# Patient Record
Sex: Female | Born: 1965 | Race: White | Hispanic: No | Smoking: Former smoker
Health system: Southern US, Community
[De-identification: ages and names within clinical notes are randomized; demographics above are authoritative.]

## PROBLEM LIST (undated history)

## (undated) DIAGNOSIS — I10 Essential (primary) hypertension: Secondary | ICD-10-CM

## (undated) DIAGNOSIS — T7840XA Allergy, unspecified, initial encounter: Secondary | ICD-10-CM

## (undated) DIAGNOSIS — F329 Major depressive disorder, single episode, unspecified: Secondary | ICD-10-CM

## (undated) DIAGNOSIS — F32A Depression, unspecified: Secondary | ICD-10-CM

## (undated) DIAGNOSIS — N809 Endometriosis, unspecified: Secondary | ICD-10-CM

## (undated) DIAGNOSIS — K219 Gastro-esophageal reflux disease without esophagitis: Secondary | ICD-10-CM

## (undated) HISTORY — DX: Major depressive disorder, single episode, unspecified: F32.9

## (undated) HISTORY — DX: Endometriosis, unspecified: N80.9

## (undated) HISTORY — DX: Depression, unspecified: F32.A

## (undated) HISTORY — DX: Gastro-esophageal reflux disease without esophagitis: K21.9

## (undated) HISTORY — DX: Allergy, unspecified, initial encounter: T78.40XA

## (undated) HISTORY — DX: Essential (primary) hypertension: I10

## (undated) HISTORY — PX: CARPAL TUNNEL RELEASE: SHX101

---

## 1991-06-29 DIAGNOSIS — N809 Endometriosis, unspecified: Secondary | ICD-10-CM

## 1991-06-29 HISTORY — PX: LAPAROSCOPIC OVARIAN CYSTECTOMY: SHX6248

## 1991-06-29 HISTORY — DX: Endometriosis, unspecified: N80.9

## 2013-12-06 LAB — HM PAP SMEAR: HM Pap smear: ABNORMAL

## 2013-12-06 LAB — HM MAMMOGRAPHY: HM Mammogram: NORMAL

## 2014-03-18 ENCOUNTER — Ambulatory Visit: Payer: Self-pay | Admitting: Family Medicine

## 2014-06-10 ENCOUNTER — Ambulatory Visit (INDEPENDENT_AMBULATORY_CARE_PROVIDER_SITE_OTHER): Payer: Managed Care, Other (non HMO) | Admitting: Internal Medicine

## 2014-06-10 ENCOUNTER — Encounter (INDEPENDENT_AMBULATORY_CARE_PROVIDER_SITE_OTHER): Payer: Self-pay

## 2014-06-10 ENCOUNTER — Encounter: Payer: Self-pay | Admitting: Internal Medicine

## 2014-06-10 VITALS — BP 124/84 | HR 85 | Temp 98.6°F | Ht 67.5 in | Wt 215.0 lb

## 2014-06-10 DIAGNOSIS — F418 Other specified anxiety disorders: Secondary | ICD-10-CM

## 2014-06-10 DIAGNOSIS — E669 Obesity, unspecified: Secondary | ICD-10-CM

## 2014-06-10 DIAGNOSIS — K219 Gastro-esophageal reflux disease without esophagitis: Secondary | ICD-10-CM

## 2014-06-10 DIAGNOSIS — F329 Major depressive disorder, single episode, unspecified: Secondary | ICD-10-CM | POA: Insufficient documentation

## 2014-06-10 DIAGNOSIS — I10 Essential (primary) hypertension: Secondary | ICD-10-CM

## 2014-06-10 DIAGNOSIS — J302 Other seasonal allergic rhinitis: Secondary | ICD-10-CM

## 2014-06-10 DIAGNOSIS — F419 Anxiety disorder, unspecified: Secondary | ICD-10-CM

## 2014-06-10 DIAGNOSIS — F32A Depression, unspecified: Secondary | ICD-10-CM | POA: Insufficient documentation

## 2014-06-10 DIAGNOSIS — G4700 Insomnia, unspecified: Secondary | ICD-10-CM | POA: Insufficient documentation

## 2014-06-10 NOTE — Assessment & Plan Note (Signed)
She is not currently exercising or following any diet plan Encouraged her to work on this

## 2014-06-10 NOTE — Progress Notes (Signed)
HPI  Pt presents to the clinic today to establish care. She is transferring care from her PCP in [place]. She recently moved to this area.  Anxiety and Depression: mild but chronic. Seems to get worse with changes in life situations. She takes the Prozac daily. She takes the xanax a few times per week. She has been seeing a therapist which has seemed to really help her a lot.  GERD: It will flare up when she eats certain things. This only occurs 1-2 times per month. She was on Prevacid at one time but reports now she maintains it with changes in her diet.  Allergies: She will take Flonase when her symptoms occur.  HTN:  Well controlled on Lisinopril. Her BP today is 124/84.  She does have some concerns today about difficulty sleeping and fatigue. She reports she goes to sleep fine but staying asleep is the issue. She gets up twice a night to urinate and is awake for about 30 minutes- 1 hour each time. She has not tried anything for this OTC.  Flu: never Tetanus: 11/2013 LMP: takes OCP continuously Pap Smear: 11/2013 Mammogram: 11/2013 Vision Screening: annually Dentist: biannually  Past Medical History  Diagnosis Date  . Depression   . GERD (gastroesophageal reflux disease)   . Allergy   . Hypertension   . Endometriosis determined by laparoscopy 1993    Current Outpatient Prescriptions  Medication Sig Dispense Refill  . ALPRAZolam (XANAX) 0.5 MG tablet Take by mouth.    Marland Kitchen. FLUoxetine (PROZAC) 20 MG capsule Take by mouth.    . fluticasone (FLONASE) 50 MCG/ACT nasal spray Place 2 sprays into both nostrils daily.    Marland Kitchen. ibuprofen (ADVIL,MOTRIN) 200 MG tablet Take by mouth.    Marland Kitchen. lisinopril (PRINIVIL,ZESTRIL) 20 MG tablet Take by mouth.    . magnesium 30 MG tablet Take 30 mg by mouth daily.    . norethindrone-ethinyl estradiol (JUNEL FE,GILDESS FE,LOESTRIN FE) 1-20 MG-MCG tablet Take by mouth.    . vitamin B-12 (CYANOCOBALAMIN) 1000 MCG tablet Take 1,000 mcg by mouth daily.     No  current facility-administered medications for this visit.    Not on File  Family History  Problem Relation Age of Onset  . Alcohol abuse Mother   . Drug abuse Mother   . Cancer Mother     uterine  . Alcohol abuse Father   . Diabetes Father   . Stroke Maternal Grandmother   . Kidney disease Maternal Grandmother     History   Social History  . Marital Status: Married    Spouse Name: N/A    Number of Children: N/A  . Years of Education: N/A   Occupational History  . Not on file.   Social History Main Topics  . Smoking status: Former Smoker    Types: Cigarettes  . Smokeless tobacco: Never Used     Comment: quit x 15 years ago  . Alcohol Use: 0.0 oz/week    0 Not specified per week     Comment: social  . Drug Use: Not on file  . Sexual Activity: Not on file   Other Topics Concern  . Not on file   Social History Narrative  . No narrative on file    ROS:  Constitutional: Pt reports fatigue. Denies fever, malaise, headache or abrupt weight changes.  HEENT: Denies eye pain, eye redness, ear pain, ringing in the ears, wax buildup, runny nose, nasal congestion, bloody nose, or sore throat. Respiratory: Denies difficulty breathing, shortness  of breath, cough or sputum production.   Cardiovascular: Denies chest pain, chest tightness, palpitations or swelling in the hands or feet.  Gastrointestinal: Denies abdominal pain, bloating, constipation, diarrhea or blood in the stool.  Skin: Denies redness, rashes, lesions or ulcercations.  Neurological: Denies dizziness, difficulty with memory, difficulty with speech or problems with balance and coordination.  Psych: Pt reports anxiety and depression. Denies SI/HI.  No other specific complaints in a complete review of systems (except as listed in HPI above).  PE:  BP 124/84 mmHg  Pulse 85  Temp(Src) 98.6 F (37 C) (Oral)  Ht 5' 7.5" (1.715 m)  Wt 215 lb (97.523 kg)  BMI 33.16 kg/m2  SpO2 98%  LMP  Wt Readings from  Last 3 Encounters:  06/10/14 215 lb (97.523 kg)    General: Appears her stated age, obese but well developed, well nourished in NAD. HEENT: Head: normal shape and size; Ears: Tm's gray and intact, normal light reflex; Nose: mucosa pink and moist, septum midline; Throat/Mouth: Teeth present, mucosa pink and moist, no lesions or ulcerations noted.  Cardiovascular: Normal rate and rhythm. S1,S2 noted.  No murmur, rubs or gallops noted.  Pulmonary/Chest: Normal effort and positive vesicular breath sounds. No respiratory distress. No wheezes, rales or ronchi noted.  Abdomen: Soft and nontender. Normal bowel sounds, no bruits noted. No distention or masses noted.  Neurological: Alert and oriented. Cranial nerves II-XII grossly intact.  Psychiatric: Mood and affect normal. Behavior is normal. Judgment and thought content normal.     Assessment and Plan:  Health Maintenance:  She declines flu shot today  RTC in 6 months for annual physical

## 2014-06-10 NOTE — Assessment & Plan Note (Signed)
Well controlled on Prozac and prn xanax She will need to complete CSA and UDS at her next visit Will continue current therapy for now Continue to see your therapist

## 2014-06-10 NOTE — Patient Instructions (Signed)

## 2014-06-10 NOTE — Assessment & Plan Note (Signed)
Controlled with Flonase prn Continue current therapy for now

## 2014-06-10 NOTE — Progress Notes (Signed)
Pre visit review using our clinic review tool, if applicable. No additional management support is needed unless otherwise documented below in the visit note. 

## 2014-06-10 NOTE — Assessment & Plan Note (Signed)
Well controlled on Lisinopril Will check CBC and CMET at next visit

## 2014-06-10 NOTE — Assessment & Plan Note (Signed)
?   If this is related to her fluid intake before bed or if it is a component of OSA Will have her cut off fluids around 8 pm If issue persist, we can do OSA workup giben obesity and HTN

## 2014-06-10 NOTE — Assessment & Plan Note (Signed)
Occassional Advised her to take Tums OTC if needed If becomes more frequent, we may need to consider H2 blocker or PPI

## 2014-06-11 ENCOUNTER — Telehealth: Payer: Self-pay | Admitting: Internal Medicine

## 2014-06-11 NOTE — Telephone Encounter (Signed)
emmi emailed °

## 2014-06-14 ENCOUNTER — Encounter: Payer: Self-pay | Admitting: Internal Medicine

## 2014-06-14 MED ORDER — ALPRAZOLAM 0.5 MG PO TABS: 0.5000 mg | ORAL_TABLET | Freq: Two times a day (BID) | ORAL | Status: DC | PRN

## 2014-06-14 NOTE — Telephone Encounter (Signed)
Rx called in to pharmacy. 

## 2014-07-23 ENCOUNTER — Other Ambulatory Visit: Payer: Self-pay | Admitting: Internal Medicine

## 2014-07-23 NOTE — Telephone Encounter (Signed)
Last filled 06/14/14--please advise

## 2014-07-24 NOTE — Telephone Encounter (Signed)
Ok to phone in xanax. Will need CSA and UDS 

## 2014-07-24 NOTE — Telephone Encounter (Signed)
Called pt was unable lmovm due to vm box being full will try again later

## 2014-07-25 ENCOUNTER — Other Ambulatory Visit: Payer: Self-pay | Admitting: Internal Medicine

## 2014-07-25 NOTE — Telephone Encounter (Signed)
Rx called in to pharmacy. 

## 2014-09-03 ENCOUNTER — Other Ambulatory Visit: Payer: Self-pay | Admitting: Internal Medicine

## 2014-09-03 NOTE — Telephone Encounter (Signed)
Last filled 06/04/15--please advise 

## 2014-09-03 NOTE — Telephone Encounter (Signed)
Ok to phone in xanax 

## 2014-09-04 NOTE — Telephone Encounter (Signed)
Rx called in to pharmacy. 

## 2014-10-07 ENCOUNTER — Other Ambulatory Visit: Payer: Self-pay | Admitting: Internal Medicine

## 2014-10-07 NOTE — Telephone Encounter (Signed)
Last filled 09/03/2014--please advise

## 2014-10-08 NOTE — Telephone Encounter (Signed)
Called pt but was unable to lmovm due to mailbox being full

## 2014-10-08 NOTE — Telephone Encounter (Signed)
Ok to phone in xanax. She needs to complete CSA and UDS

## 2014-10-09 NOTE — Telephone Encounter (Signed)
Spoke with pt and she is aware she will ned to come in to fill out CSA before her xanax can be filled--pt expressed understanding and stated she will come into the office within the next couple of days

## 2014-10-09 NOTE — Telephone Encounter (Signed)
Called pt but mailbox is still full on vm so unable to lmovm--will try again later

## 2014-10-14 ENCOUNTER — Other Ambulatory Visit: Payer: Self-pay | Admitting: Internal Medicine

## 2014-10-15 ENCOUNTER — Encounter: Payer: Self-pay | Admitting: Internal Medicine

## 2014-10-15 MED ORDER — ALPRAZOLAM 0.5 MG PO TABS: 0.5000 mg | ORAL_TABLET | Freq: Two times a day (BID) | ORAL | Status: DC | PRN

## 2014-10-15 NOTE — Addendum Note (Signed)
Addended by: Roena MaladyEVONTENNO, Jaslene Marsteller Y on: 10/15/2014 11:37 AM   Modules accepted: Orders

## 2014-10-15 NOTE — Telephone Encounter (Signed)
Pt left v/m wanting to know if alprazolam was going to be refilled; pt request cb at [phone].

## 2014-10-15 NOTE — Telephone Encounter (Signed)
Rx called in to pharmacy. 

## 2014-10-15 NOTE — Telephone Encounter (Signed)
Error

## 2014-10-21 ENCOUNTER — Other Ambulatory Visit: Payer: Self-pay | Admitting: Nurse Practitioner

## 2014-11-21 ENCOUNTER — Other Ambulatory Visit: Payer: Self-pay | Admitting: Internal Medicine

## 2014-11-21 NOTE — Telephone Encounter (Signed)
Last filled 10/15/2014--please advise

## 2014-11-21 NOTE — Telephone Encounter (Signed)
Ok to phone in xanax 

## 2014-11-22 NOTE — Telephone Encounter (Signed)
Rx called in to pharmacy. 

## 2014-12-26 ENCOUNTER — Telehealth: Payer: Self-pay | Admitting: Internal Medicine

## 2014-12-26 NOTE — Telephone Encounter (Signed)
Last filled 11/21/2014--please advise

## 2014-12-26 NOTE — Telephone Encounter (Signed)
She was only taking this prn, it seems now she is taking daily. I want her to repeat her UDS with her next refill. OK to phone in Xanax

## 2015-01-06 ENCOUNTER — Other Ambulatory Visit: Payer: Self-pay | Admitting: Internal Medicine

## 2015-01-07 ENCOUNTER — Other Ambulatory Visit: Payer: Self-pay | Admitting: Internal Medicine

## 2015-01-07 MED ORDER — ALPRAZOLAM 0.5 MG PO TABS: 0.5000 mg | ORAL_TABLET | Freq: Two times a day (BID) | ORAL | Status: DC | PRN

## 2015-01-07 NOTE — Telephone Encounter (Signed)
Rx called into pharmacy i checked with the pharmacy and last refill was 10/2014---looks like i never received msg for approval

## 2015-01-07 NOTE — Telephone Encounter (Signed)
Melissa Cowan- Can you check to see if this was called in?

## 2015-01-07 NOTE — Telephone Encounter (Signed)
Rx has been called --email has already been sent to pt

## 2015-01-07 NOTE — Telephone Encounter (Signed)
Pt left v/m requesting cb with reason for denial of xanax.

## 2015-01-22 ENCOUNTER — Ambulatory Visit (INDEPENDENT_AMBULATORY_CARE_PROVIDER_SITE_OTHER): Payer: Managed Care, Other (non HMO) | Admitting: Internal Medicine

## 2015-01-22 ENCOUNTER — Encounter: Payer: Self-pay | Admitting: Internal Medicine

## 2015-01-22 VITALS — BP 128/82 | HR 74 | Temp 98.7°F | Ht 67.0 in | Wt 211.0 lb

## 2015-01-22 DIAGNOSIS — F418 Other specified anxiety disorders: Secondary | ICD-10-CM | POA: Diagnosis not present

## 2015-01-22 DIAGNOSIS — K219 Gastro-esophageal reflux disease without esophagitis: Secondary | ICD-10-CM | POA: Diagnosis not present

## 2015-01-22 DIAGNOSIS — Z0000 Encounter for general adult medical examination without abnormal findings: Secondary | ICD-10-CM

## 2015-01-22 DIAGNOSIS — R5383 Other fatigue: Secondary | ICD-10-CM

## 2015-01-22 DIAGNOSIS — I10 Essential (primary) hypertension: Secondary | ICD-10-CM | POA: Diagnosis not present

## 2015-01-22 DIAGNOSIS — R1031 Right lower quadrant pain: Secondary | ICD-10-CM

## 2015-01-22 DIAGNOSIS — F329 Major depressive disorder, single episode, unspecified: Secondary | ICD-10-CM

## 2015-01-22 DIAGNOSIS — G4700 Insomnia, unspecified: Secondary | ICD-10-CM | POA: Diagnosis not present

## 2015-01-22 DIAGNOSIS — F419 Anxiety disorder, unspecified: Secondary | ICD-10-CM

## 2015-01-22 DIAGNOSIS — J302 Other seasonal allergic rhinitis: Secondary | ICD-10-CM

## 2015-01-22 DIAGNOSIS — E669 Obesity, unspecified: Secondary | ICD-10-CM

## 2015-01-22 DIAGNOSIS — R1032 Left lower quadrant pain: Secondary | ICD-10-CM

## 2015-01-22 LAB — COMPREHENSIVE METABOLIC PANEL
ALBUMIN: 4.1 g/dL (ref 3.5–5.2)
ALT: 15 U/L (ref 0–35)
AST: 14 U/L (ref 0–37)
Alkaline Phosphatase: 57 U/L (ref 39–117)
BILIRUBIN TOTAL: 0.3 mg/dL (ref 0.2–1.2)
BUN: 11 mg/dL (ref 6–23)
CO2: 27 mEq/L (ref 19–32)
Calcium: 9.4 mg/dL (ref 8.4–10.5)
Chloride: 102 mEq/L (ref 96–112)
Creatinine, Ser: 0.7 mg/dL (ref 0.40–1.20)
GFR: 95.45 mL/min (ref >60.00)
Glucose, Bld: 74 mg/dL (ref 70–99)
POTASSIUM: 4.1 meq/L (ref 3.5–5.1)
SODIUM: 135 meq/L (ref 135–145)
TOTAL PROTEIN: 7.2 g/dL (ref 6.0–8.3)

## 2015-01-22 LAB — CBC
HCT: 35.9 % — ABNORMAL LOW (ref 36.0–46.0)
HEMOGLOBIN: 11.9 g/dL — AB (ref 12.0–15.0)
MCHC: 33.3 g/dL (ref 30.0–36.0)
MCV: 86.6 fl (ref 78.0–100.0)
PLATELETS: 336 10*3/uL (ref 150.0–400.0)
RBC: 4.14 Mil/uL (ref 3.87–5.11)
RDW: 13.8 % (ref 11.5–15.5)
WBC: 7.7 10*3/uL (ref 4.0–10.5)

## 2015-01-22 LAB — LIPID PANEL
CHOL/HDL RATIO: 4
Cholesterol: 178 mg/dL (ref 0–200)
HDL: 49.1 mg/dL (ref >39.00)
NonHDL: 128.9
Triglycerides: 361 mg/dL — ABNORMAL HIGH (ref 0.0–149.0)
VLDL: 72.2 mg/dL — ABNORMAL HIGH (ref 0.0–40.0)

## 2015-01-22 LAB — LDL CHOLESTEROL, DIRECT: Direct LDL: 85 mg/dL

## 2015-01-22 LAB — VITAMIN D 25 HYDROXY (VIT D DEFICIENCY, FRACTURES): VITD: 19.17 ng/mL — AB (ref 30.00–100.00)

## 2015-01-22 LAB — TSH: TSH: 2 u[IU]/mL (ref 0.35–4.50)

## 2015-01-22 LAB — HEMOGLOBIN A1C: Hgb A1c MFr Bld: 5.4 % (ref 4.6–6.5)

## 2015-01-22 LAB — VITAMIN B12: VITAMIN B 12: 286 pg/mL (ref 211–911)

## 2015-01-22 NOTE — Assessment & Plan Note (Signed)
Likely the cause of her chest symptoms Start taking Zantac daily Will check CBC and CMET today

## 2015-01-22 NOTE — Assessment & Plan Note (Signed)
Continue Xanax prn to help sleep Will check CBC and CMET today

## 2015-01-22 NOTE — Assessment & Plan Note (Signed)
Continue Flonase prn 

## 2015-01-22 NOTE — Progress Notes (Signed)
Pre visit review using our clinic review tool, if applicable. No additional management support is needed unless otherwise documented below in the visit note. 

## 2015-01-22 NOTE — Assessment & Plan Note (Signed)
Well controlled on Prozac and prn Xanax Will check CBC and CMET today

## 2015-01-22 NOTE — Patient Instructions (Signed)

## 2015-01-22 NOTE — Progress Notes (Signed)
Subjective:    Patient ID: Melissa Cowan, female    DOB: [birth year], 49 y.o.   MRN: [id number]  HPI  Pt presents to the clinic today for her annual exam. She also wants to follow up her chronic conditions.  Flu: never Tetanus: 11/2013 LMP: No menses on OCP Pap Smear: 11/2013 Mammogram: 11/2013 Vision Screening: as needed Dentist: biannually  Diet: She has cut out red meat and dairy. She is eating a lot of fish. She does consume some fruits and veggies. She drinks mostly water and green tea. Exercise: She is walking at the gym 1 day per week.  HTN: BP well controlled on Lisinopril. She has had an odd sensation in her chest for the last 2 weeks. She describes the sensation as a "catch". She denies chest pain, chest tightness or shortness of breath. Her BP today is 128/82.  GERD: Triggered by soda, tomato sauce, and spicy food. She takes Zantac prn.  Anxiety and Depression: Symptoms controlled on Prozac. She takes Xanax 1-2 times per day.  Seasonal Allergies: Worse in the spring. She takes Flonase prn.  Insomnia: She takes the Xanax at night every night to help her sleep.  She has been having abdominal pain. This occurred yesterday in her lower abdomen. She describes the pain as crampy. She reports it felt like a mixture of menstrual cramps and gas. She did try some GasX with some relief. She has not had issues with constipation or diarrhea. No blood in her stool. She did have similar pains in the past but was not worked up for it because the pain went away. She does have a history of endometriosis. She denies any vaginal or urinary complaints. She does not menstruate due to her OCP.  She also reports fatigue. This has been intermittent over the last 2 months. She did join a gym 2 weeks ago.  Review of Systems      Past Medical History  Diagnosis Date  . Depression   . GERD (gastroesophageal reflux disease)   . Allergy   . Hypertension   . Endometriosis determined by  laparoscopy 1993    Current Outpatient Prescriptions  Medication Sig Dispense Refill  . ALPRAZolam (XANAX) 0.5 MG tablet Take 1 tablet (0.5 mg total) by mouth 2 (two) times daily as needed. 60 tablet 0  . FLUoxetine (PROZAC) 20 MG capsule Take by mouth.    . fluticasone (FLONASE) 50 MCG/ACT nasal spray Place 2 sprays into both nostrils daily.    Marland Kitchen ibuprofen (ADVIL,MOTRIN) 200 MG tablet Take by mouth.    Marland Kitchen lisinopril (PRINIVIL,ZESTRIL) 20 MG tablet Take by mouth.    . magnesium 30 MG tablet Take 30 mg by mouth daily.    . norethindrone-ethinyl estradiol (JUNEL FE,GILDESS FE,LOESTRIN FE) 1-20 MG-MCG tablet Take by mouth.    . vitamin B-12 (CYANOCOBALAMIN) 1000 MCG tablet Take 1,000 mcg by mouth daily.     No current facility-administered medications for this visit.    Allergies  Allergen Reactions  . Amoxicillin-Pot Clavulanate Rash  . Tetracycline Rash    Family History  Problem Relation Age of Onset  . Alcohol abuse Mother   . Drug abuse Mother   . Cancer Mother     uterine  . Alcohol abuse Father   . Diabetes Father   . Stroke Maternal Grandmother   . Kidney disease Maternal Grandmother     History   Social History  . Marital Status: Married    Spouse Name: N/A  . Number  of Children: N/A  . Years of Education: N/A   Occupational History  . Not on file.   Social History Main Topics  . Smoking status: Former Smoker    Types: Cigarettes  . Smokeless tobacco: Never Used     Comment: quit x 15 years ago  . Alcohol Use: 0.0 oz/week    0 Standard drinks or equivalent per week     Comment: social  . Drug Use: No  . Sexual Activity: Not Currently   Other Topics Concern  . Not on file   Social History Narrative     Constitutional: Pt reports fatigue. Denies fever, malaise, headache or abrupt weight changes.  HEENT: Denies eye pain, eye redness, ear pain, ringing in the ears, wax buildup, runny nose, nasal congestion, bloody nose, or sore throat. Respiratory:  Denies difficulty breathing, shortness of breath, cough or sputum production.   Cardiovascular: Denies chest pain, chest tightness, palpitations or swelling in the hands or feet.  Gastrointestinal: Pt reports lower abdominal pain. Denies bloating, constipation, diarrhea or blood in the stool.  GU: Denies urgency, frequency, pain with urination, burning sensation, blood in urine, odor or discharge. Musculoskeletal: Denies decrease in range of motion, difficulty with gait, muscle pain or joint pain and swelling.  Skin: Denies redness, rashes, lesions or ulcercations.  Neurological: Denies dizziness, difficulty with memory, difficulty with speech or problems with balance and coordination.  Psych: Pt has chronic anxiety and depression. Denies SI/HI.  No other specific complaints in a complete review of systems (except as listed in HPI above).  Objective:   Physical Exam   BP 128/82 mmHg  Pulse 74  Temp(Src) 98.7 F (37.1 C) (Oral)  Ht  (1.702 m)  Wt 211 lb (95.709 kg)  BMI 33.04 kg/m2  SpO2 98% Wt Readings from Last 3 Encounters:  01/22/15 211 lb (95.709 kg)  06/10/14 215 lb (97.523 kg)    General: Appears her stated age, obese in NAD. Skin: Warm, dry and intact. No rashes, lesions or ulcerations noted. HEENT: Head: normal shape and size; Eyes: sclera white, no icterus, conjunctiva pink, PERRLA and EOMs intact; Ears: Tm's gray and intact, normal light reflex;  Throat/Mouth: Teeth present, mucosa pink and moist, no exudate, lesions or ulcerations noted.  Neck: Neck supple, trachea midline. No masses, lumps or thyromegaly present.  Cardiovascular: Normal rate and rhythm. S1,S2 noted.  No murmur, rubs or gallops noted. No JVD or BLE edema. No carotid bruits noted. Pulmonary/Chest: Normal effort and positive vesicular breath sounds. No respiratory distress. No wheezes, rales or ronchi noted.  Abdomen: Soft and nontender. Normal bowel sounds, no bruits noted. No distention or masses  noted. Liver, spleen and kidneys non palpable. Musculoskeletal: Normal range of motion. Strength 5/5 BUE/BLE. No difficulty with gait.  Neurological: Alert and oriented. Cranial nerves II-XII grossly intact. Coordination normal.  Psychiatric: She seems mildly anxious. Mood and affect normal. Behavior is normal. Judgment and thought content normal.       Assessment & Plan:   Preventative Health Maintenance:  Advised her to get a flu shot in the fall Tetanus UTD Mammogram and Pap Smear UTD Encouraged her to see an eye doctor at least annually Continue seeing a dentist biannually Encouraged her to work on diet and exercise  Fatigue:  Will check CBC, CMET, TSH, Vit D, and B12 today  Lower abdominal pain:  Symptoms have resolved at this time Will check CBC, CMET today  RTC in 6 months to follow up chronic conditions

## 2015-01-22 NOTE — Assessment & Plan Note (Signed)
Encouraged her to work on diet an exercise

## 2015-01-22 NOTE — Assessment & Plan Note (Addendum)
Well controlled on Lisinopril CBC and CMET today ECG today - flipped t waves in V1 but otherwise normal

## 2015-01-23 ENCOUNTER — Encounter: Payer: Self-pay | Admitting: Internal Medicine

## 2015-01-23 ENCOUNTER — Other Ambulatory Visit: Payer: Self-pay | Admitting: Internal Medicine

## 2015-01-23 MED ORDER — VITAMIN D (ERGOCALCIFEROL) 1.25 MG (50000 UNIT) PO CAPS: 50000.0000 [IU] | ORAL_CAPSULE | ORAL | Status: DC

## 2015-02-10 ENCOUNTER — Other Ambulatory Visit: Payer: Self-pay | Admitting: Internal Medicine

## 2015-02-10 NOTE — Telephone Encounter (Signed)
Ok to phone in xanax 

## 2015-02-10 NOTE — Telephone Encounter (Signed)
Last filled 01/07/15--please advise 

## 2015-02-11 NOTE — Telephone Encounter (Signed)
Rx called in to pharmacy. 

## 2015-02-27 ENCOUNTER — Encounter: Payer: Self-pay | Admitting: Internal Medicine

## 2015-02-27 ENCOUNTER — Ambulatory Visit (INDEPENDENT_AMBULATORY_CARE_PROVIDER_SITE_OTHER): Payer: Managed Care, Other (non HMO) | Admitting: Internal Medicine

## 2015-02-27 VITALS — BP 132/86 | HR 76 | Temp 98.7°F | Wt 213.0 lb

## 2015-02-27 DIAGNOSIS — J069 Acute upper respiratory infection, unspecified: Secondary | ICD-10-CM | POA: Diagnosis not present

## 2015-02-27 DIAGNOSIS — B9789 Other viral agents as the cause of diseases classified elsewhere: Principal | ICD-10-CM

## 2015-02-27 MED ORDER — AZITHROMYCIN 250 MG PO TABS: ORAL_TABLET | ORAL | Status: DC

## 2015-02-27 MED ORDER — HYDROCODONE-HOMATROPINE 5-1.5 MG/5ML PO SYRP: 5.0000 mL | ORAL_SOLUTION | Freq: Three times a day (TID) | ORAL | Status: DC | PRN

## 2015-02-27 NOTE — Patient Instructions (Signed)
Cough, Adult  A cough is a reflex that helps clear your throat and airways. It can help heal the body or may be a reaction to an irritated airway. A cough may only last 2 or 3 weeks (acute) or may last more than 8 weeks (chronic).  CAUSES Acute cough:  Viral or bacterial infections. Chronic cough:  Infections.  Allergies.  Asthma.  Post-nasal drip.  Smoking.  Heartburn or acid reflux.  Some medicines.  Chronic lung problems (COPD).  Cancer. SYMPTOMS   Cough.  Fever.  Chest pain.  Increased breathing rate.  High-pitched whistling sound when breathing (wheezing).  Colored mucus that you cough up (sputum). TREATMENT   A bacterial cough may be treated with antibiotic medicine.  A viral cough must run its course and will not respond to antibiotics.  Your caregiver may recommend other treatments if you have a chronic cough. HOME CARE INSTRUCTIONS   Only take over-the-counter or prescription medicines for pain, discomfort, or fever as directed by your caregiver. Use cough suppressants only as directed by your caregiver.  Use a cold steam vaporizer or humidifier in your bedroom or home to help loosen secretions.  Sleep in a semi-upright position if your cough is worse at night.  Rest as needed.  Stop smoking if you smoke. SEEK IMMEDIATE MEDICAL CARE IF:   You have pus in your sputum.  Your cough starts to worsen.  You cannot control your cough with suppressants and are losing sleep.  You begin coughing up blood.  You have difficulty breathing.  You develop pain which is getting worse or is uncontrolled with medicine.  You have a fever. MAKE SURE YOU:   Understand these instructions.  Will watch your condition.  Will get help right away if you are not doing well or get worse. Document Released: 12/11/2010 Document Revised: 09/06/2011 Document Reviewed: 12/11/2010 Exit[employer] Patient Information 2015 [employer]. This information is not intended  to replace advice given to you by your health care provider. Make sure you discuss any questions you have with your health care provider.  

## 2015-02-27 NOTE — Progress Notes (Signed)
Pre visit review using our clinic review tool, if applicable. No additional management support is needed unless otherwise documented below in the visit note. 

## 2015-02-27 NOTE — Progress Notes (Signed)
HPI  Pt presents to the clinic today with headache and cough. This started 1 week ago. The headache is located in her forehead. She describes the pain as throbbing. She does have some chest congestion as well. The cough is productive of yellow mucous. She does have some associated shortness of breath and wheezing. She has tried Tylenol sinus, Sudafed, Claritin and Flonase with minimal relief. She does have a history of seasonal allergies. She has not had sick contacts that she is aware of. She is former smoker.  Review of Systems      Past Medical History  Diagnosis Date  . Depression   . GERD (gastroesophageal reflux disease)   . Allergy   . Hypertension   . Endometriosis determined by laparoscopy 1993    Family History  Problem Relation Age of Onset  . Alcohol abuse Mother   . Drug abuse Mother   . Cancer Mother     uterine  . Alcohol abuse Father   . Diabetes Father   . Stroke Maternal Grandmother   . Kidney disease Maternal Grandmother     Social History   Social History  . Marital Status: Married    Spouse Name: N/A  . Number of Children: N/A  . Years of Education: N/A   Occupational History  . Not on file.   Social History Main Topics  . Smoking status: Former Smoker    Types: Cigarettes  . Smokeless tobacco: Never Used     Comment: quit x 15 years ago  . Alcohol Use: 0.0 oz/week    0 Standard drinks or equivalent per week     Comment: social  . Drug Use: No  . Sexual Activity: Not Currently   Other Topics Concern  . Not on file   Social History Narrative    Allergies  Allergen Reactions  . Amoxicillin-Pot Clavulanate Rash  . Tetracycline Rash     Constitutional: Positive headache, fatigueDenies fever or abrupt weight changes.  HEENT:  Denies eye redness, eye pain, pressure behind the eyes, facial pain, nasal congestion, ear pain, ringing in the ears, wax buildup, runny nose or sore throat. Respiratory: Positive cough and shortness of breath.  Denies difficulty breathing.  Cardiovascular: Denies chest pain, chest tightness, palpitations or swelling in the hands or feet.   No other specific complaints in a complete review of systems (except as listed in HPI above).  Objective:   Pulse 76  Temp(Src) 98.7 F (37.1 C) (Oral)  Wt 213 lb (96.616 kg)  SpO2 98% Wt Readings from Last 3 Encounters:  02/27/15 213 lb (96.616 kg)  01/22/15 211 lb (95.709 kg)  06/10/14 215 lb (97.523 kg)     General: Appears her stated age, well developed, well nourished in NAD. HEENT: Head: normal shape and size, no sinus tenderness noted; Eyes: sclera white, no icterus, conjunctiva pink; Ears: Tm's gray and intact, normal light reflex; Nose: mucosa pink and moist, septum midline; Throat/Mouth: Teeth present, mucosa pink and moist, no exudate noted, no lesions or ulcerations noted.  Neck: No cervical lymphadenopathy.  Cardiovascular: Normal rate and rhythm. S1,S2 noted.  No murmur, rubs or gallops noted.  Pulmonary/Chest: Normal effort and diminshed vesicular breath sounds. No respiratory distress. No wheezes, rales or ronchi noted.      Assessment & Plan:   Upper Respiratory Infection:  Likely viral at this time Get some rest and drink plenty of water Try Mucinex Rx for Hycodan cough syrup for cough  Given printed RX for zpack to  start on Saturday if symptoms seem to be worsening  RTC as needed or if symptoms persist.

## 2015-03-13 ENCOUNTER — Other Ambulatory Visit: Payer: Self-pay | Admitting: Internal Medicine

## 2015-03-13 NOTE — Telephone Encounter (Signed)
Ok to phone in Xanax 

## 2015-03-13 NOTE — Telephone Encounter (Signed)
Last filled 02/10/15--please advise

## 2015-03-13 NOTE — Telephone Encounter (Signed)
Rx called in to pharmacy. 

## 2015-03-17 ENCOUNTER — Ambulatory Visit (INDEPENDENT_AMBULATORY_CARE_PROVIDER_SITE_OTHER): Payer: Managed Care, Other (non HMO) | Admitting: Nurse Practitioner

## 2015-03-17 ENCOUNTER — Encounter: Payer: Self-pay | Admitting: Nurse Practitioner

## 2015-03-17 ENCOUNTER — Other Ambulatory Visit: Payer: Self-pay | Admitting: Internal Medicine

## 2015-03-17 VITALS — BP 132/98 | HR 80 | Temp 98.3°F | Resp 16 | Ht 67.0 in | Wt 192.0 lb

## 2015-03-17 DIAGNOSIS — J069 Acute upper respiratory infection, unspecified: Secondary | ICD-10-CM

## 2015-03-17 DIAGNOSIS — B9789 Other viral agents as the cause of diseases classified elsewhere: Principal | ICD-10-CM

## 2015-03-17 NOTE — Assessment & Plan Note (Signed)
Viral symptoms x 2 days. Patient does not seem acutely ill. Discussed continuing conservative therapy. Pt is okay with this. Follow up if fever of 101 or not improved by day 7 of symptoms.
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
## 2015-03-17 NOTE — Patient Instructions (Signed)
Upper Respiratory Infection, Adult An upper respiratory infection (URI) is also sometimes known as the common cold. The upper respiratory tract includes the nose, sinuses, throat, trachea, and bronchi. Bronchi are the airways leading to the lungs. Most people improve within 1 week, but symptoms can last up to 2 weeks. A residual cough may last even longer.  CAUSES Many different viruses can infect the tissues lining the upper respiratory tract. The tissues become irritated and inflamed and often become very moist. Mucus production is also common. A cold is contagious. You can easily spread the virus to others by oral contact. This includes kissing, sharing a glass, coughing, or sneezing. Touching your mouth or nose and then touching a surface, which is then touched by another person, can also spread the virus. SYMPTOMS  Symptoms typically develop 1 to 3 days after you come in contact with a cold virus. Symptoms vary from person to person. They may include:  Runny nose.  Sneezing.  Nasal congestion.  Sinus irritation.  Sore throat.  Loss of voice (laryngitis).  Cough.  Fatigue.  Muscle aches.  Loss of appetite.  Headache.  Low-grade fever. DIAGNOSIS  You might diagnose your own cold based on familiar symptoms, since most people get a cold 2 to 3 times a year. Your caregiver can confirm this based on your exam. Most importantly, your caregiver can check that your symptoms are not due to another disease such as strep throat, sinusitis, pneumonia, asthma, or epiglottitis. Blood tests, throat tests, and X-rays are not necessary to diagnose a common cold, but they may sometimes be helpful in excluding other more serious diseases. Your caregiver will decide if any further tests are required. RISKS AND COMPLICATIONS  You may be at risk for a more severe case of the common cold if you smoke cigarettes, have chronic heart disease (such as heart failure) or lung disease (such as asthma), or if  you have a weakened immune system. The very young and very old are also at risk for more serious infections. Bacterial sinusitis, middle ear infections, and bacterial pneumonia can complicate the common cold. The common cold can worsen asthma and chronic obstructive pulmonary disease (COPD). Sometimes, these complications can require emergency medical care and may be life-threatening. PREVENTION  The best way to protect against getting a cold is to practice good hygiene. Avoid oral or hand contact with people with cold symptoms. Wash your hands often if contact occurs. There is no clear evidence that vitamin C, vitamin E, echinacea, or exercise reduces the chance of developing a cold. However, it is always recommended to get plenty of rest and practice good nutrition. TREATMENT  Treatment is directed at relieving symptoms. There is no cure. Antibiotics are not effective, because the infection is caused by a virus, not by bacteria. Treatment may include:  Increased fluid intake. Sports drinks offer valuable electrolytes, sugars, and fluids.  Breathing heated mist or steam (vaporizer or shower).  Eating chicken soup or other clear broths, and maintaining good nutrition.  Getting plenty of rest.  Using gargles or lozenges for comfort.  Controlling fevers with ibuprofen or acetaminophen as directed by your caregiver.  Increasing usage of your inhaler if you have asthma. Zinc gel and zinc lozenges, taken in the first 24 hours of the common cold, can shorten the duration and lessen the severity of symptoms. Pain medicines may help with fever, muscle aches, and throat pain. A variety of non-prescription medicines are available to treat congestion and runny nose. Your caregiver   can make recommendations and may suggest nasal or lung inhalers for other symptoms.  HOME CARE INSTRUCTIONS   Only take over-the-counter or prescription medicines for pain, discomfort, or fever as directed by your  caregiver.  Use a warm mist humidifier or inhale steam from a shower to increase air moisture. This may keep secretions moist and make it easier to breathe.  Drink enough water and fluids to keep your urine clear or pale yellow.  Rest as needed.  Return to work when your temperature has returned to normal or as your caregiver advises. You may need to stay home longer to avoid infecting others. You can also use a face mask and careful hand washing to prevent spread of the virus. SEEK MEDICAL CARE IF:   After the first few days, you feel you are getting worse rather than better.  You need your caregiver's advice about medicines to control symptoms.  You develop chills, worsening shortness of breath, or brown or red sputum. These may be signs of pneumonia.  You develop yellow or brown nasal discharge or pain in the face, especially when you bend forward. These may be signs of sinusitis.  You develop a fever, swollen neck glands, pain with swallowing, or white areas in the back of your throat. These may be signs of strep throat. SEEK IMMEDIATE MEDICAL CARE IF:   You have a fever.  You develop severe or persistent headache, ear pain, sinus pain, or chest pain.  You develop wheezing, a prolonged cough, cough up blood, or have a change in your usual mucus (if you have chronic lung disease).  You develop sore muscles or a stiff neck. Document Released: 12/08/2000 Document Revised: 09/06/2011 Document Reviewed: 09/19/2013 Exit[employer] Patient Information 2015 [employer]. This information is not intended to replace advice given to you by your health care provider. Make sure you discuss any questions you have with your health care provider.  

## 2015-03-17 NOTE — Progress Notes (Signed)
Pre visit review using our clinic review tool, if applicable. No additional management support is needed unless otherwise documented below in the visit note. 

## 2015-03-17 NOTE — Telephone Encounter (Signed)
Last OV was CPE 01/22/15--please advise as these medications have never been filled by you before

## 2015-03-19 ENCOUNTER — Ambulatory Visit: Payer: Self-pay | Admitting: Nurse Practitioner

## 2015-04-10 ENCOUNTER — Other Ambulatory Visit: Payer: Self-pay | Admitting: Internal Medicine

## 2015-04-21 ENCOUNTER — Other Ambulatory Visit: Payer: Self-pay | Admitting: Internal Medicine

## 2015-04-21 DIAGNOSIS — E559 Vitamin D deficiency, unspecified: Secondary | ICD-10-CM

## 2015-04-21 NOTE — Telephone Encounter (Signed)
I want her to have a UDS, she has been getting monthly. Was negative in her system with last UDS.

## 2015-04-21 NOTE — Telephone Encounter (Signed)
Last filled 03/13/2015--please advise

## 2015-04-23 NOTE — Telephone Encounter (Signed)
You can fill now and have her get UDS before next refill

## 2015-04-23 NOTE — Telephone Encounter (Signed)
Being that it was last filled more than 30 days ago do you still want UDS before refilling or within 1 week of refilling--please advise

## 2015-04-25 NOTE — Telephone Encounter (Signed)
Pt had UDS 09/2014 says yearly so not due as pt take PRN---Rx called into pharmacy Also pt wants to recheck vit d levels lab future ordered pt will call back to schedule lab only appt as she is requesting refill on Rx vit d

## 2015-04-25 NOTE — Addendum Note (Signed)
Addended by: Roena MaladyEVONTENNO, Donice Alperin Y on: 04/25/2015 10:36 AM   Modules accepted: Orders

## 2015-05-26 ENCOUNTER — Other Ambulatory Visit: Payer: Self-pay | Admitting: Internal Medicine

## 2015-05-27 ENCOUNTER — Ambulatory Visit (INDEPENDENT_AMBULATORY_CARE_PROVIDER_SITE_OTHER): Payer: Managed Care, Other (non HMO) | Admitting: Internal Medicine

## 2015-05-27 ENCOUNTER — Encounter: Payer: Self-pay | Admitting: Internal Medicine

## 2015-05-27 VITALS — BP 124/82 | HR 77 | Temp 98.3°F | Wt 213.0 lb

## 2015-05-27 DIAGNOSIS — R232 Flushing: Secondary | ICD-10-CM

## 2015-05-27 DIAGNOSIS — R300 Dysuria: Secondary | ICD-10-CM | POA: Diagnosis not present

## 2015-05-27 DIAGNOSIS — B3731 Acute candidiasis of vulva and vagina: Secondary | ICD-10-CM

## 2015-05-27 DIAGNOSIS — N951 Menopausal and female climacteric states: Secondary | ICD-10-CM

## 2015-05-27 DIAGNOSIS — B373 Candidiasis of vulva and vagina: Secondary | ICD-10-CM | POA: Diagnosis not present

## 2015-05-27 LAB — POCT URINALYSIS DIPSTICK
SPEC GRAV UA: 1.01
pH, UA: 6

## 2015-05-27 LAB — LUTEINIZING HORMONE: LH: 2.82 m[IU]/mL

## 2015-05-27 LAB — FOLLICLE STIMULATING HORMONE: FSH: 6.7 m[IU]/mL

## 2015-05-27 MED ORDER — FLUCONAZOLE 150 MG PO TABS: ORAL_TABLET | ORAL | Status: DC

## 2015-05-27 NOTE — Patient Instructions (Signed)

## 2015-05-27 NOTE — Progress Notes (Signed)
HPI  Pt presents to the clinic today with c/o vaginal discharge. This started 1 week ago. The discharge started out white, but is clearer now, but has a fowl odor. She has noticed some vaginal spotting but no real bleeding (she missed a dose of her OCP a few days ago). She reports pelvic pressure and low back pain as well as vaginal irritation. She started having dysuria 3 days ago. She has tried AZO without any relief. She is sexually active and would like testing for GC today.  She also wants to know if she is postmenopausal. She does not have periods because she is on continuous OCP. She has felt more depressed, moody and she has been having hot flashes.   Review of Systems  Past Medical History  Diagnosis Date  . Depression   . GERD (gastroesophageal reflux disease)   . Allergy   . Hypertension   . Endometriosis determined by laparoscopy 1993    Family History  Problem Relation Age of Onset  . Alcohol abuse Mother   . Drug abuse Mother   . Cancer Mother     uterine  . Alcohol abuse Father   . Diabetes Father   . Stroke Maternal Grandmother   . Kidney disease Maternal Grandmother     Social History   Social History  . Marital Status: Married    Spouse Name: N/A  . Number of Children: N/A  . Years of Education: N/A   Occupational History  . Not on file.   Social History Main Topics  . Smoking status: Former Smoker    Types: Cigarettes  . Smokeless tobacco: Never Used     Comment: quit x 15 years ago  . Alcohol Use: 0.0 oz/week    0 Standard drinks or equivalent per week     Comment: social  . Drug Use: No  . Sexual Activity: Not Currently   Other Topics Concern  . Not on file   Social History Narrative    Allergies  Allergen Reactions  . Amoxicillin-Pot Clavulanate Rash  . Tetracycline Rash    Constitutional: Denies fever, malaise, fatigue, headache or abrupt weight changes.  GI: Pt reports pelvic pressure. Denies constipation, diarrhea or blood in  her stool.  GU: Pt reports dysuria, vaginal discharge, odor and spotting. Denies urgency, frequency, burning sensation. Skin: Denies redness, rashes, lesions or ulcercations.   No other specific complaints in a complete review of systems (except as listed in HPI above).    Objective:   Physical Exam  BP 124/82 mmHg  Pulse 77  Temp(Src) 98.3 F (36.8 C) (Oral)  Wt 213 lb (96.616 kg)  SpO2 98%  Wt Readings from Last 3 Encounters:  05/27/15 213 lb (96.616 kg)  03/17/15 192 lb (87.091 kg)  02/27/15 213 lb (96.616 kg)    General: Appears her stated age, in NAD. Cardiovascular: Normal rate and rhythm. S1,S2 noted.   Pulmonary/Chest: Normal effort and positive vesicular breath sounds. No respiratory distress. No wheezes, rales or ronchi noted.  Abdomen: Soft. Normal bowel sounds. No distention or masses noted.  Nontender to palpation over the bladder area. No CVA tenderness. Pelvic: Normal female anatomy. Vaginal irritation noted on the outside. Vaginal bleeding noted. No other discharge or odor noted.      Assessment & Plan:   Dysuria, pelvic pressure, vaginal irriation secondary to Candida Vaginitis  Urinalysis: AZO interferes Will send urine culture OK to take AZO OTC Drink plenty of fluids Wet prep: + yeast, no clue cells  Gen probe today- will call you with the results Will check FSH/LH today  RTC as needed or if symptoms persist.

## 2015-05-27 NOTE — Addendum Note (Signed)
Addended by: Roena MaladyEVONTENNO, Dyllen Menning Y on: 05/27/2015 05:02 PM   Modules accepted: Orders

## 2015-05-27 NOTE — Addendum Note (Signed)
Addended by: Roena MaladyEVONTENNO, Bethaney Oshana Y on: 05/27/2015 03:41 PM   Modules accepted: Orders

## 2015-05-27 NOTE — Addendum Note (Signed)
Addended by: Alvina ChouWALSH, Ancil Dewan J on: 05/27/2015 05:00 PM   Modules accepted: Orders

## 2015-05-27 NOTE — Progress Notes (Signed)
Pre visit review using our clinic review tool, if applicable. No additional management support is needed unless otherwise documented below in the visit note. 

## 2015-05-28 ENCOUNTER — Ambulatory Visit: Payer: Self-pay | Admitting: Internal Medicine

## 2015-05-28 LAB — GC/CHLAMYDIA PROBE AMP
CT PROBE, AMP APTIMA: NEGATIVE
GC Probe RNA: NEGATIVE

## 2015-05-29 LAB — URINE CULTURE

## 2015-06-10 ENCOUNTER — Encounter: Payer: Self-pay | Admitting: Internal Medicine

## 2015-06-10 ENCOUNTER — Other Ambulatory Visit: Payer: Self-pay | Admitting: Internal Medicine

## 2015-06-10 DIAGNOSIS — B3731 Acute candidiasis of vulva and vagina: Secondary | ICD-10-CM

## 2015-06-10 DIAGNOSIS — B373 Candidiasis of vulva and vagina: Secondary | ICD-10-CM

## 2015-06-10 MED ORDER — FLUCONAZOLE 150 MG PO TABS: ORAL_TABLET | ORAL | Status: DC

## 2015-06-11 ENCOUNTER — Other Ambulatory Visit: Payer: Self-pay | Admitting: Internal Medicine

## 2015-06-11 NOTE — Telephone Encounter (Signed)
Rx called in to pharmacy. 

## 2015-06-11 NOTE — Telephone Encounter (Signed)
Last filled 04/23/2015--please advise

## 2015-06-11 NOTE — Telephone Encounter (Signed)
Ok to phone in Xanax 

## 2015-07-27 ENCOUNTER — Other Ambulatory Visit: Payer: Self-pay | Admitting: Internal Medicine

## 2015-07-29 ENCOUNTER — Encounter: Payer: Self-pay | Admitting: Internal Medicine

## 2015-07-29 ENCOUNTER — Ambulatory Visit (INDEPENDENT_AMBULATORY_CARE_PROVIDER_SITE_OTHER): Payer: Managed Care, Other (non HMO) | Admitting: Internal Medicine

## 2015-07-29 VITALS — BP 126/84 | HR 72 | Temp 98.3°F | Wt 208.0 lb

## 2015-07-29 DIAGNOSIS — B373 Candidiasis of vulva and vagina: Secondary | ICD-10-CM

## 2015-07-29 DIAGNOSIS — B3731 Acute candidiasis of vulva and vagina: Secondary | ICD-10-CM

## 2015-07-29 MED ORDER — FLUCONAZOLE 100 MG PO TABS: 100.0000 mg | ORAL_TABLET | Freq: Every day | ORAL | Status: DC

## 2015-07-29 MED ORDER — NORETHINDRONE ACET-ETHINYL EST 1-20 MG-MCG PO TABS: ORAL_TABLET | ORAL | Status: DC

## 2015-07-29 NOTE — Progress Notes (Signed)
HPI  Pt presents to the clinic today with c/o vaginal discharge. This started 1 week has been going on intermittently for 2 months. She was seen 05/27/15 for the same, and prescribed Diflucan for a vaginal yeast infection.  She reports she got minimal relief from the Diflucan. The discharge is yellow/brown in color. She has noticed a foul odor. She has noticed some vaginal bleeding. She han noticed vaginal irritation and swelling. She has tried AZO and hydrocortisone cream without any relief. She is sexually active. She did test negative for [facility] 05/27/15. She does request a refill of her Junel today as well. She tried to make an appt with her GYN but reports she could not get through to them.    Review of Systems  Past Medical History  Diagnosis Date  . Depression   . GERD (gastroesophageal reflux disease)   . Allergy   . Hypertension   . Endometriosis determined by laparoscopy 1993    Family History  Problem Relation Age of Onset  . Alcohol abuse Mother   . Drug abuse Mother   . Cancer Mother     uterine  . Alcohol abuse Father   . Diabetes Father   . Stroke Maternal Grandmother   . Kidney disease Maternal Grandmother     Social History   Social History  . Marital Status: Married    Spouse Name: N/A  . Number of Children: N/A  . Years of Education: N/A   Occupational History  . Not on file.   Social History Main Topics  . Smoking status: Former Smoker    Types: Cigarettes  . Smokeless tobacco: Never Used     Comment: quit x 15 years ago  . Alcohol Use: 0.0 oz/week    0 Standard drinks or equivalent per week     Comment: social  . Drug Use: No  . Sexual Activity: Not Currently   Other Topics Concern  . Not on file   Social History Narrative    Allergies  Allergen Reactions  . Amoxicillin-Pot Clavulanate Rash  . Tetracycline Rash    Constitutional: Denies fever, malaise, fatigue, headache or abrupt weight changes.  GI: Pt reports pelvic pressure. Denies  constipation, diarrhea or blood in her stool.  GU: Pt reports vaginal discharge, odor and spotting. Denies urgency, frequency, burning sensation. Skin: Denies redness, rashes, lesions or ulcercations.   No other specific complaints in a complete review of systems (except as listed in HPI above).    Objective:   Physical Exam  BP 126/84 mmHg  Pulse 72  Temp(Src) 98.3 F (36.8 C) (Oral)  Wt 208 lb (94.348 kg)  SpO2 99%   Wt Readings from Last 3 Encounters:  05/27/15 213 lb (96.616 kg)  03/17/15 192 lb (87.091 kg)  02/27/15 213 lb (96.616 kg)    General: Appears her stated age, in NAD. Cardiovascular: Normal rate and rhythm. S1,S2 noted.   Pulmonary/Chest: Normal effort and positive vesicular breath sounds. No respiratory distress. No wheezes, rales or ronchi noted.  Abdomen: Soft. Normal bowel sounds. No distention or masses noted.  Nontender to palpation over the bladder area. No CVA tenderness. Pelvic: Normal female anatomy. Vaginal irritation noted on the outside. Moderate amount of burgandy/white discharge noted.      Assessment & Plan:   Candida Vaginitis  Wet prep: + yeast, no clue cells, no trich Diflucan 100 mg daily x 7 days Junel refilled If persist, make an appt with GYN  RTC as needed or if symptoms  persist.

## 2015-07-29 NOTE — Patient Instructions (Signed)

## 2015-07-29 NOTE — Progress Notes (Signed)
Pre visit review using our clinic review tool, if applicable. No additional management support is needed unless otherwise documented below in the visit note. 

## 2015-08-04 ENCOUNTER — Encounter: Payer: Self-pay | Admitting: Internal Medicine

## 2015-08-06 ENCOUNTER — Other Ambulatory Visit: Payer: Self-pay | Admitting: Internal Medicine

## 2015-08-06 ENCOUNTER — Encounter: Payer: Self-pay | Admitting: Internal Medicine

## 2015-08-06 NOTE — Telephone Encounter (Signed)
Last filled 06/11/2015--please advise 

## 2015-08-06 NOTE — Telephone Encounter (Signed)
Ok to phone in Xanax 

## 2015-08-07 ENCOUNTER — Encounter: Payer: Self-pay | Admitting: Internal Medicine

## 2015-08-07 ENCOUNTER — Other Ambulatory Visit: Payer: Self-pay | Admitting: Internal Medicine

## 2015-08-07 NOTE — Telephone Encounter (Signed)
Rx called in to pharmacy. 

## 2015-09-29 ENCOUNTER — Other Ambulatory Visit: Payer: Self-pay | Admitting: Internal Medicine

## 2015-09-29 NOTE — Telephone Encounter (Signed)
Rx called in to pharmacy. 

## 2015-09-29 NOTE — Telephone Encounter (Signed)
Ok to phone in Xanax 

## 2015-09-29 NOTE — Telephone Encounter (Signed)
Last filled 08/06/15-please advise

## 2015-10-03 ENCOUNTER — Other Ambulatory Visit: Payer: Self-pay | Admitting: Internal Medicine

## 2015-12-01 ENCOUNTER — Other Ambulatory Visit: Payer: Self-pay | Admitting: Internal Medicine

## 2015-12-01 NOTE — Telephone Encounter (Signed)
Last filled 09/29/2015--please advise

## 2015-12-01 NOTE — Telephone Encounter (Signed)
Ok to phone in Xanax 

## 2015-12-02 NOTE — Telephone Encounter (Signed)
Rx called in to pharmacy. 

## 2016-01-12 ENCOUNTER — Other Ambulatory Visit: Payer: Self-pay | Admitting: Internal Medicine

## 2016-01-16 ENCOUNTER — Other Ambulatory Visit: Payer: Self-pay | Admitting: Internal Medicine

## 2016-01-16 NOTE — Telephone Encounter (Signed)
Ok to phone in Xanax 

## 2016-01-16 NOTE — Telephone Encounter (Signed)
Last filled 12/01/15--please advise

## 2016-01-16 NOTE — Telephone Encounter (Signed)
Rx called in to pharmacy. 

## 2016-03-03 ENCOUNTER — Ambulatory Visit (INDEPENDENT_AMBULATORY_CARE_PROVIDER_SITE_OTHER): Payer: Managed Care, Other (non HMO) | Admitting: Internal Medicine

## 2016-03-03 ENCOUNTER — Encounter: Payer: Self-pay | Admitting: Internal Medicine

## 2016-03-03 VITALS — BP 132/84 | HR 78 | Temp 98.5°F | Wt 213.0 lb

## 2016-03-03 DIAGNOSIS — J309 Allergic rhinitis, unspecified: Secondary | ICD-10-CM | POA: Diagnosis not present

## 2016-03-03 MED ORDER — FEXOFENADINE HCL 180 MG PO TABS: 180.0000 mg | ORAL_TABLET | Freq: Every day | ORAL | 5 refills | Status: DC

## 2016-03-03 MED ORDER — METHYLPREDNISOLONE ACETATE 80 MG/ML IJ SUSP
80.0000 mg | Freq: Once | INTRAMUSCULAR | Status: AC
  Administered 2016-03-03: 80 mg via INTRAMUSCULAR

## 2016-03-03 MED ORDER — FLUTICASONE PROPIONATE 50 MCG/ACT NA SUSP: 2.0000 | Freq: Every day | NASAL | 2 refills | Status: DC

## 2016-03-03 NOTE — Progress Notes (Signed)
HPI  Pt presents to the clinic today with c/o ear pain, runny nose, and post nasal drip. This started. She describes the ear pain as burning. She denies decreased hearing. She is blowing clear mucous out of her nose. She denies cough or chest congestion. She denies fever, but has had chills and body aches. She has tried Flonase, Mucinex D and Catering manager without any relief. She does have a history of allergies and reports she recently travelled from [place] so it is possible that she could have had sick contacts.  Review of Systems      Past Medical History:  Diagnosis Date  . Allergy   . Depression   . Endometriosis determined by laparoscopy 1993  . GERD (gastroesophageal reflux disease)   . Hypertension     Family History  Problem Relation Age of Onset  . Alcohol abuse Mother   . Drug abuse Mother   . Cancer Mother     uterine  . Alcohol abuse Father   . Diabetes Father   . Stroke Maternal Grandmother   . Kidney disease Maternal Grandmother     Social History   Social History  . Marital status: Married    Spouse name: N/A  . Number of children: N/A  . Years of education: N/A   Occupational History  . Not on file.   Social History Main Topics  . Smoking status: Former Smoker    Types: Cigarettes  . Smokeless tobacco: Never Used     Comment: quit x 15 years ago  . Alcohol use 0.0 oz/week     Comment: social  . Drug use: No  . Sexual activity: Not Currently   Other Topics Concern  . Not on file   Social History Narrative  . No narrative on file    Allergies  Allergen Reactions  . Amoxicillin-Pot Clavulanate Rash  . Tetracycline Rash     Constitutional:  Denies headache, fatigue, fever or abrupt weight changes.  HEENT:  Positive ear pain, runny nose, sore throat. Denies eye redness, eye pain, pressure behind the eyes, facial pain, nasal congestion, ear pain, ringing in the ears, wax buildup, runny nose or bloody nose. Respiratory: Denies difficulty  breathing, cough or shortness of breath.  Cardiovascular: Denies chest pain, chest tightness, palpitations or swelling in the hands or feet.   No other specific complaints in a complete review of systems (except as listed in HPI above).  Objective:   BP 132/84   Pulse 78   Temp 98.5 F (36.9 C) (Oral)   Wt 213 lb (96.6 kg)   SpO2 98%   BMI 33.36 kg/m   Wt Readings from Last 3 Encounters:  03/03/16 213 lb (96.6 kg)  07/29/15 208 lb (94.3 kg)  05/27/15 213 lb (96.6 kg)     General: Appears her stated age,  in NAD. HEENT: Head: normal shape and size, no sinus tenderness noted; Eyes: sclera white, no icterus, conjunctiva pink; Ears: Tm's gray and intact, normal light reflex, + serous effusion bilaterally; Nose: mucosa pink and moist, septum midline; Throat/Mouth: + PND. Teeth present, mucosa pink and moist, no exudate noted, no lesions or ulcerations noted.  Neck: No cervical lymphadenopathy.  Cardiovascular: Normal rate and rhythm. S1,S2 noted.  No murmur, rubs or gallops noted.  Pulmonary/Chest: Normal effort and positive vesicular breath sounds. No respiratory distress. No wheezes, rales or ronchi noted.      Assessment & Plan:   Allergic Rhinitis:  Get some rest and drink plenty of  water Do salt water gargles for the sore throat 80 mg Depo IM today eRx for Allegra 180 mg daily QHS eRx for Flonase 1 spray each nostril QAM Return precautions discussed   RTC as needed or if symptoms persist.   Nicki ReaperBAITY, REGINA, NP

## 2016-03-03 NOTE — Addendum Note (Signed)
Addended by: Roena MaladyEVONTENNO, MELANIE Y on: 03/03/2016 06:15 PM   Modules accepted: Orders

## 2016-03-03 NOTE — Patient Instructions (Signed)
Allergic Rhinitis Allergic rhinitis is when the mucous membranes in the nose respond to allergens. Allergens are particles in the air that cause your body to have an allergic reaction. This causes you to release allergic antibodies. Through a chain of events, these eventually cause you to release histamine into the blood stream. Although meant to protect the body, it is this release of histamine that causes your discomfort, such as frequent sneezing, congestion, and an itchy, runny nose.  CAUSES Seasonal allergic rhinitis (hay fever) is caused by pollen allergens that may come from grasses, trees, and weeds. Year-round allergic rhinitis (perennial allergic rhinitis) is caused by allergens such as house dust mites, pet dander, and mold spores. SYMPTOMS  Nasal stuffiness (congestion).  Itchy, runny nose with sneezing and tearing of the eyes. DIAGNOSIS Your health care provider can help you determine the allergen or allergens that trigger your symptoms. If you and your health care provider are unable to determine the allergen, skin or blood testing may be used. Your health care provider will diagnose your condition after taking your health history and performing a physical exam. Your health care provider may assess you for other related conditions, such as asthma, pink eye, or an ear infection. TREATMENT Allergic rhinitis does not have a cure, but it can be controlled by:  Medicines that block allergy symptoms. These may include allergy shots, nasal sprays, and oral antihistamines.  Avoiding the allergen. Hay fever may often be treated with antihistamines in pill or nasal spray forms. Antihistamines block the effects of histamine. There are over-the-counter medicines that may help with nasal congestion and swelling around the eyes. Check with your health care provider before taking or giving this medicine. If avoiding the allergen or the medicine prescribed do not work, there are many new medicines  your health care provider can prescribe. Stronger medicine may be used if initial measures are ineffective. Desensitizing injections can be used if medicine and avoidance does not work. Desensitization is when a patient is given ongoing shots until the body becomes less sensitive to the allergen. Make sure you follow up with your health care provider if problems continue. HOME CARE INSTRUCTIONS It is not possible to completely avoid allergens, but you can reduce your symptoms by taking steps to limit your exposure to them. It helps to know exactly what you are allergic to so that you can avoid your specific triggers. SEEK MEDICAL CARE IF:  You have a fever.  You develop a cough that does not stop easily (persistent).  You have shortness of breath.  You start wheezing.  Symptoms interfere with normal daily activities.   This information is not intended to replace advice given to you by your health care provider. Make sure you discuss any questions you have with your health care provider.   Document Released: 03/09/2001 Document Revised: 07/05/2014 Document Reviewed: 02/19/2013 Elsevier Interactive Patient Education 2016 [employer]  

## 2016-03-05 ENCOUNTER — Other Ambulatory Visit: Payer: Self-pay | Admitting: Internal Medicine

## 2016-03-05 NOTE — Telephone Encounter (Signed)
Rx called in to pharmacy. 

## 2016-03-05 NOTE — Telephone Encounter (Signed)
Ok to phone in Xanax 

## 2016-03-05 NOTE — Telephone Encounter (Signed)
Last filled 01/16/16--please advise 

## 2016-04-06 ENCOUNTER — Other Ambulatory Visit: Payer: Self-pay | Admitting: Internal Medicine

## 2016-04-06 NOTE — Telephone Encounter (Signed)
Last filled 03/05/16--please advise 

## 2016-04-07 NOTE — Telephone Encounter (Signed)
Rx called in to pharmacy. 

## 2016-04-07 NOTE — Telephone Encounter (Signed)
Ok to phone in Xanax 

## 2016-04-11 ENCOUNTER — Other Ambulatory Visit: Payer: Self-pay | Admitting: Internal Medicine

## 2016-04-12 NOTE — Telephone Encounter (Signed)
Last filled 06/2015 with 5 refills--has upcoming CPE appt 04/2016--please advise if okay to refill

## 2016-04-14 ENCOUNTER — Other Ambulatory Visit: Payer: Self-pay | Admitting: Internal Medicine

## 2016-04-16 ENCOUNTER — Other Ambulatory Visit: Payer: Self-pay | Admitting: Internal Medicine

## 2016-04-16 MED ORDER — ALPRAZOLAM 0.5 MG PO TABS: 0.5000 mg | ORAL_TABLET | Freq: Two times a day (BID) | ORAL | 0 refills | Status: DC | PRN

## 2016-04-16 NOTE — Addendum Note (Signed)
Addended by: Patience MuscaISLEY, Keishaun Hazel M on: 04/16/2016 01:07 PM   Modules accepted: Orders

## 2016-04-16 NOTE — Telephone Encounter (Signed)
[facility] [address] called requesting refill alprazolam 0.5 mg; advised called in on 04/07/16; [facility].Medication phoned to [facility] [address] [facility] as instructed.

## 2016-04-20 ENCOUNTER — Other Ambulatory Visit: Payer: Self-pay | Admitting: Internal Medicine

## 2016-05-13 ENCOUNTER — Other Ambulatory Visit: Payer: Self-pay | Admitting: Internal Medicine

## 2016-05-13 NOTE — Telephone Encounter (Signed)
Ok to phone in Xanax to be filled on or afte 11/20  *please change class to "phone in"

## 2016-05-13 NOTE — Telephone Encounter (Signed)
Last filled 03/2016--please advise 

## 2016-05-17 ENCOUNTER — Encounter: Payer: Self-pay | Admitting: Internal Medicine

## 2016-05-17 NOTE — Telephone Encounter (Signed)
Rx called in to pharmacy. 

## 2016-05-24 ENCOUNTER — Other Ambulatory Visit (HOSPITAL_COMMUNITY)
Admission: RE | Admit: 2016-05-24 | Discharge: 2016-05-24 | Disposition: A | Discharge status: Home / Self Care | Payer: Managed Care, Other (non HMO) | Source: Ambulatory Visit | Attending: Internal Medicine | Admitting: Internal Medicine

## 2016-05-24 ENCOUNTER — Ambulatory Visit (INDEPENDENT_AMBULATORY_CARE_PROVIDER_SITE_OTHER): Payer: Managed Care, Other (non HMO) | Admitting: Internal Medicine

## 2016-05-24 ENCOUNTER — Encounter: Payer: Self-pay | Admitting: Internal Medicine

## 2016-05-24 VITALS — BP 122/82 | HR 76 | Temp 97.5°F | Ht 67.0 in | Wt 213.0 lb

## 2016-05-24 DIAGNOSIS — F419 Anxiety disorder, unspecified: Secondary | ICD-10-CM

## 2016-05-24 DIAGNOSIS — F329 Major depressive disorder, single episode, unspecified: Secondary | ICD-10-CM

## 2016-05-24 DIAGNOSIS — Z0000 Encounter for general adult medical examination without abnormal findings: Secondary | ICD-10-CM | POA: Diagnosis not present

## 2016-05-24 DIAGNOSIS — K219 Gastro-esophageal reflux disease without esophagitis: Secondary | ICD-10-CM | POA: Diagnosis not present

## 2016-05-24 DIAGNOSIS — Z01419 Encounter for gynecological examination (general) (routine) without abnormal findings: Secondary | ICD-10-CM | POA: Insufficient documentation

## 2016-05-24 DIAGNOSIS — I10 Essential (primary) hypertension: Secondary | ICD-10-CM

## 2016-05-24 DIAGNOSIS — F5101 Primary insomnia: Secondary | ICD-10-CM | POA: Diagnosis not present

## 2016-05-24 DIAGNOSIS — Z124 Encounter for screening for malignant neoplasm of cervix: Secondary | ICD-10-CM | POA: Diagnosis not present

## 2016-05-24 DIAGNOSIS — J301 Allergic rhinitis due to pollen: Secondary | ICD-10-CM | POA: Diagnosis not present

## 2016-05-24 DIAGNOSIS — F418 Other specified anxiety disorders: Secondary | ICD-10-CM

## 2016-05-24 DIAGNOSIS — Z1151 Encounter for screening for human papillomavirus (HPV): Secondary | ICD-10-CM | POA: Insufficient documentation

## 2016-05-24 MED ORDER — FLUOXETINE HCL 40 MG PO CAPS: 40.0000 mg | ORAL_CAPSULE | Freq: Every day | ORAL | 11 refills | Status: DC

## 2016-05-24 MED ORDER — LISINOPRIL 20 MG PO TABS: 20.0000 mg | ORAL_TABLET | Freq: Every day | ORAL | 11 refills | Status: DC

## 2016-05-24 NOTE — Assessment & Plan Note (Signed)
Chronic We recently increase Prozac and she seems to be doing better Continue Xanax Referral placed to psychology for therapy

## 2016-05-24 NOTE — Assessment & Plan Note (Signed)
Encouraged her to avoid foods that trigger her reflux Discussed how weight loss could help improve her symptoms Continue Zantac prn

## 2016-05-24 NOTE — Assessment & Plan Note (Signed)
Stable with Xanax at night Will monitor

## 2016-05-24 NOTE — Assessment & Plan Note (Signed)
Chronic Continue Claritin and Flonase as needed

## 2016-05-24 NOTE — Assessment & Plan Note (Signed)
Controlled on Lisinopril- refilled  today CMET reviewed

## 2016-05-24 NOTE — Progress Notes (Signed)
Subjective:    Patient ID: Melissa Cowan, female    DOB: 1965-11-03, 50 y.o.   MRN: [id number]  HPI  Pt presents to the clinic today for her annual exam. She also wants to follow up her chronic conditions.  HTN: BP well controlled on Lisinopril. She denies chest pain, chest tightness or shortness of breath. Her BP today is 122/82. ECG from 12/2014 reviewed.  GERD: Triggered by soda, tomato sauce, and spicy food. She reports she is taking the Zantac left often because her diet had improved.  Anxiety and Depression: Triggered by general life stress. Symptoms controlled on Prozac. She takes Xanax 1-2 times per day. She is requesting a referral to see a therapist.  Seasonal Allergies: Worse in the spring. She takes Claritin and Flonase prn with good relief.  Insomnia: She thinks this is secondary to her anxiety. She takes the Xanax at night every night to help her sleep.  Flu: 04/2016 Tetanus: 11/2013 Pap Smear: 11/2013, abnormal Mammogram: 11/2013 Vision Screening: as needed Dentist: as needed  Diet: She does eat meat. She is eating a lot of fish. She does consume some fruits and veggies. She try avoid fries food. She drinks mostly water and green tea. Exercise: None    Review of Systems      Past Medical History:  Diagnosis Date  . Allergy   . Depression   . Endometriosis determined by laparoscopy 1993  . GERD (gastroesophageal reflux disease)   . Hypertension     Current Outpatient Prescriptions  Medication Sig Dispense Refill  . ALPRAZolam (XANAX) 0.5 MG tablet TAKE 1 TABLET BY MOUTH TWICE DAILY AS NEEDED 60 tablet 0  . fexofenadine (ALLEGRA ALLERGY) 180 MG tablet Take 1 tablet (180 mg total) by mouth daily. 30 tablet 5  . FLUoxetine (PROZAC) 20 MG capsule TAKE 3 CAPSULES BY MOUTH EVERY DAY 90 capsule 2  . fluticasone (FLONASE) 50 MCG/ACT nasal spray Place 2 sprays into both nostrils daily. 16 g 2  . ibuprofen (ADVIL,MOTRIN) 200 MG tablet Take by mouth.    . JUNEL  1/20 1-20 MG-MCG tablet TAKE 1 TABLET BY MOUTH DAILY, TAKE CONTINUOUSLY 21 tablet 5  . lisinopril (PRINIVIL,ZESTRIL) 20 MG tablet TAKE 1 TABLET BY MOUTH DAILY 30 tablet 11   No current facility-administered medications for this visit.     Allergies  Allergen Reactions  . Amoxicillin-Pot Clavulanate Rash  . Tetracycline Rash    Family History  Problem Relation Age of Onset  . Alcohol abuse Mother   . Drug abuse Mother   . Cancer Mother     uterine  . Alcohol abuse Father   . Diabetes Father   . Stroke Maternal Grandmother   . Kidney disease Maternal Grandmother     Social History   Social History  . Marital status: Married    Spouse name: N/A  . Number of children: N/A  . Years of education: N/A   Occupational History  . Not on file.   Social History Main Topics  . Smoking status: Former Smoker    Types: Cigarettes  . Smokeless tobacco: Never Used     Comment: quit x 15 years ago  . Alcohol use 0.0 oz/week     Comment: social  . Drug use: No  . Sexual activity: Not Currently   Other Topics Concern  . Not on file   Social History Narrative  . No narrative on file     Constitutional: Denies fever, malaise, headache, fatigue or abrupt weight  changes.  HEENT: Pt reports hearing loss. Denies eye pain, eye redness, ear pain, ringing in the ears, wax buildup, runny nose, nasal congestion, bloody nose, or sore throat. Respiratory: Denies difficulty breathing, shortness of breath, cough or sputum production.   Cardiovascular: Denies chest pain, chest tightness, palpitations or swelling in the hands or feet.  Gastrointestinal: Denies abdominal pain bloating, constipation, diarrhea or blood in the stool.  GU: Denies urgency, frequency, pain with urination, burning sensation, blood in urine, odor or discharge. Musculoskeletal: Denies decrease in range of motion, difficulty with gait, muscle pain or joint pain and swelling.  Skin: Pt reports splinter in left great toe.  Denies redness, rashes, lesions or ulcercations.  Neurological: Denies dizziness, difficulty with memory, difficulty with speech or problems with balance and coordination.  Psych: Pt has chronic anxiety and depression. Denies SI/HI.  No other specific complaints in a complete review of systems (except as listed in HPI above).  Objective:   Physical Exam  BP 122/82   Pulse 76   Temp 97.5 F (36.4 C) (Oral)   Ht 5\' 7"  (1.702 m)   Wt 213 lb (96.6 kg)   SpO2 98%   BMI 33.36 kg/m   Wt Readings from Last 3 Encounters:  03/03/16 213 lb (96.6 kg)  07/29/15 208 lb (94.3 kg)  05/27/15 213 lb (96.6 kg)    General: Appears her stated age, obese in NAD. Skin: Warm, dry and intact. No splinter noted in great toe. HEENT: Head: normal shape and size; Eyes: sclera white, no icterus, conjunctiva pink, PERRLA and EOMs intact; Ears: Tm's gray and intact, normal light reflex;  Throat/Mouth: Teeth present, mucosa pink and moist, no exudate, lesions or ulcerations noted.  Neck: Neck supple, trachea midline. No masses, lumps or thyromegaly present.  Cardiovascular: Normal rate and rhythm. S1,S2 noted.  No murmur, rubs or gallops noted. No JVD or BLE edema. No carotid bruits noted. Pulmonary/Chest: Normal effort and positive vesicular breath sounds. No respiratory distress. No wheezes, rales or ronchi noted.  Abdomen: Soft and nontender. Normal bowel sounds. No distention or masses noted. Liver, spleen and kidneys non palpable. Pelvic: Normal female anatomy. Cervix without changed. Small amount of white discharge noted. No CMT. Adnexa non palpable. Musculoskeletal: Normal range of motion. Strength 5/5 BUE/BLE. No difficulty with gait.  Neurological: Alert and oriented. Cranial nerves II-XII grossly intact. Coordination normal.  Psychiatric: She is slightly tearful today. Behavior is normal. Judgment and thought content normal.       Assessment & Plan:   Preventative Health Maintenance:  Flu and  tetanus UTD Mammogram ordered- she will call [facility] to schedule Pap smear today- she declines STD testing Encouraged her to work on diet and exercise Advised her to see an eye doctor and dentist annually Labs from 2 weeks ago reviewed  RTC in 1 year, sooner if needed Nicki ReaperBAITY, REGINA, NP

## 2016-05-24 NOTE — Patient Instructions (Signed)

## 2016-05-25 ENCOUNTER — Encounter: Payer: Self-pay | Admitting: Internal Medicine

## 2016-05-26 LAB — CYTOLOGY - PAP
Diagnosis: NEGATIVE
HPV (WINDOPATH): NOT DETECTED

## 2016-06-02 ENCOUNTER — Ambulatory Visit
Admission: RE | Admit: 2016-06-02 | Discharge: 2016-06-02 | Disposition: A | Discharge status: Home / Self Care | Payer: Managed Care, Other (non HMO) | Source: Ambulatory Visit | Attending: Internal Medicine | Admitting: Internal Medicine

## 2016-06-02 DIAGNOSIS — Z1231 Encounter for screening mammogram for malignant neoplasm of breast: Secondary | ICD-10-CM | POA: Diagnosis not present

## 2016-06-02 DIAGNOSIS — Z0000 Encounter for general adult medical examination without abnormal findings: Secondary | ICD-10-CM

## 2016-06-09 ENCOUNTER — Ambulatory Visit (INDEPENDENT_AMBULATORY_CARE_PROVIDER_SITE_OTHER): Payer: Managed Care, Other (non HMO) | Admitting: Psychology

## 2016-06-09 DIAGNOSIS — F411 Generalized anxiety disorder: Secondary | ICD-10-CM

## 2016-06-10 ENCOUNTER — Inpatient Hospital Stay
Admission: RE | Admit: 2016-06-10 | Discharge: 2016-06-10 | Disposition: A | Discharge status: Home / Self Care | Payer: Self-pay | Source: Ambulatory Visit | Attending: *Deleted | Admitting: *Deleted

## 2016-06-10 ENCOUNTER — Other Ambulatory Visit: Payer: Self-pay | Admitting: *Deleted

## 2016-06-10 DIAGNOSIS — Z9289 Personal history of other medical treatment: Secondary | ICD-10-CM

## 2016-06-15 ENCOUNTER — Ambulatory Visit (INDEPENDENT_AMBULATORY_CARE_PROVIDER_SITE_OTHER): Payer: Managed Care, Other (non HMO) | Admitting: Psychology

## 2016-06-15 DIAGNOSIS — F321 Major depressive disorder, single episode, moderate: Secondary | ICD-10-CM | POA: Diagnosis not present

## 2016-06-16 ENCOUNTER — Other Ambulatory Visit: Payer: Self-pay | Admitting: Internal Medicine

## 2016-06-16 NOTE — Telephone Encounter (Signed)
Rx called in to pharmacy. 

## 2016-06-16 NOTE — Telephone Encounter (Signed)
Ok to phone in Xanax 

## 2016-06-16 NOTE — Telephone Encounter (Signed)
Last filled 05/17/16--please advise 

## 2016-06-19 ENCOUNTER — Other Ambulatory Visit: Payer: Self-pay | Admitting: Internal Medicine

## 2016-06-29 ENCOUNTER — Ambulatory Visit: Payer: Managed Care, Other (non HMO) | Admitting: Psychology

## 2016-07-09 ENCOUNTER — Other Ambulatory Visit: Payer: Self-pay | Admitting: Internal Medicine

## 2016-07-13 ENCOUNTER — Ambulatory Visit: Payer: Managed Care, Other (non HMO) | Admitting: Psychology

## 2016-07-20 ENCOUNTER — Other Ambulatory Visit: Payer: Self-pay | Admitting: Internal Medicine

## 2016-07-20 ENCOUNTER — Ambulatory Visit: Payer: Self-pay | Admitting: Psychology

## 2016-07-20 NOTE — Telephone Encounter (Signed)
Ok to phone in Xanax  *please change class to "phone in" 

## 2016-07-20 NOTE — Telephone Encounter (Signed)
Last filled 06/16/2016--please advise

## 2016-07-20 NOTE — Telephone Encounter (Signed)
Rx called in to pharmacy. 

## 2016-07-26 ENCOUNTER — Other Ambulatory Visit: Payer: Self-pay | Admitting: Internal Medicine

## 2016-07-28 ENCOUNTER — Ambulatory Visit (INDEPENDENT_AMBULATORY_CARE_PROVIDER_SITE_OTHER): Payer: Managed Care, Other (non HMO) | Admitting: Psychology

## 2016-07-28 DIAGNOSIS — F411 Generalized anxiety disorder: Secondary | ICD-10-CM | POA: Diagnosis not present

## 2016-08-11 ENCOUNTER — Ambulatory Visit (INDEPENDENT_AMBULATORY_CARE_PROVIDER_SITE_OTHER): Payer: Managed Care, Other (non HMO) | Admitting: Psychology

## 2016-08-11 DIAGNOSIS — F331 Major depressive disorder, recurrent, moderate: Secondary | ICD-10-CM | POA: Diagnosis not present

## 2016-08-15 ENCOUNTER — Other Ambulatory Visit: Payer: Self-pay | Admitting: Internal Medicine

## 2016-08-25 ENCOUNTER — Other Ambulatory Visit: Payer: Self-pay | Admitting: Internal Medicine

## 2016-08-25 NOTE — Telephone Encounter (Signed)
Rx called in to pharmacy. 

## 2016-08-25 NOTE — Telephone Encounter (Signed)
Ok to phone in Xanax 

## 2016-08-25 NOTE — Telephone Encounter (Signed)
Last filled 07/20/2016--please advise 

## 2016-09-01 ENCOUNTER — Ambulatory Visit (INDEPENDENT_AMBULATORY_CARE_PROVIDER_SITE_OTHER): Payer: Managed Care, Other (non HMO) | Admitting: Psychology

## 2016-09-01 DIAGNOSIS — F331 Major depressive disorder, recurrent, moderate: Secondary | ICD-10-CM | POA: Diagnosis not present

## 2016-09-15 ENCOUNTER — Ambulatory Visit: Payer: Managed Care, Other (non HMO) | Admitting: Psychology

## 2016-09-23 ENCOUNTER — Other Ambulatory Visit: Payer: Self-pay | Admitting: Internal Medicine

## 2016-09-23 NOTE — Telephone Encounter (Signed)
Last filled 08/25/16--please advise

## 2016-09-23 NOTE — Telephone Encounter (Signed)
Ok to phone in Xanax 

## 2016-09-27 ENCOUNTER — Other Ambulatory Visit: Payer: Self-pay | Admitting: Internal Medicine

## 2016-09-27 NOTE — Telephone Encounter (Signed)
Called in medication to the pharmacy as instructed. 

## 2016-09-29 ENCOUNTER — Ambulatory Visit (INDEPENDENT_AMBULATORY_CARE_PROVIDER_SITE_OTHER): Payer: Managed Care, Other (non HMO) | Admitting: Psychology

## 2016-09-29 DIAGNOSIS — F331 Major depressive disorder, recurrent, moderate: Secondary | ICD-10-CM | POA: Diagnosis not present

## 2016-10-13 ENCOUNTER — Ambulatory Visit (INDEPENDENT_AMBULATORY_CARE_PROVIDER_SITE_OTHER): Payer: Managed Care, Other (non HMO) | Admitting: Psychology

## 2016-10-13 DIAGNOSIS — F331 Major depressive disorder, recurrent, moderate: Secondary | ICD-10-CM | POA: Diagnosis not present

## 2016-10-27 ENCOUNTER — Ambulatory Visit: Payer: Managed Care, Other (non HMO) | Admitting: Psychology

## 2016-11-10 ENCOUNTER — Ambulatory Visit (INDEPENDENT_AMBULATORY_CARE_PROVIDER_SITE_OTHER): Payer: Managed Care, Other (non HMO) | Admitting: Psychology

## 2016-11-10 DIAGNOSIS — F331 Major depressive disorder, recurrent, moderate: Secondary | ICD-10-CM

## 2016-11-16 ENCOUNTER — Other Ambulatory Visit: Payer: Self-pay | Admitting: Internal Medicine

## 2016-11-16 NOTE — Telephone Encounter (Signed)
Rx called in to pharmacy. 

## 2016-11-16 NOTE — Telephone Encounter (Signed)
Last filled 09/23/16--please advise 

## 2016-11-16 NOTE — Telephone Encounter (Signed)
Ok to phone in Xanax 

## 2016-11-26 ENCOUNTER — Other Ambulatory Visit: Payer: Self-pay | Admitting: Internal Medicine

## 2016-12-07 ENCOUNTER — Ambulatory Visit (INDEPENDENT_AMBULATORY_CARE_PROVIDER_SITE_OTHER): Payer: Managed Care, Other (non HMO) | Admitting: Internal Medicine

## 2016-12-07 ENCOUNTER — Telehealth: Payer: Self-pay | Admitting: Internal Medicine

## 2016-12-07 ENCOUNTER — Encounter: Payer: Self-pay | Admitting: Internal Medicine

## 2016-12-07 VITALS — BP 140/92 | HR 79 | Ht 67.0 in | Wt 205.0 lb

## 2016-12-07 DIAGNOSIS — I10 Essential (primary) hypertension: Secondary | ICD-10-CM

## 2016-12-07 DIAGNOSIS — B002 Herpesviral gingivostomatitis and pharyngotonsillitis: Secondary | ICD-10-CM

## 2016-12-07 MED ORDER — VALACYCLOVIR HCL 1 G PO TABS: 1000.0000 mg | ORAL_TABLET | Freq: Three times a day (TID) | ORAL | 2 refills | Status: DC

## 2016-12-07 NOTE — Progress Notes (Signed)
   Subjective:    Patient ID: Melissa Cowan, female    DOB: July 29, 1965, 51 y.o.   MRN: [id number]  HPI  Here with onset x 5 days of rather severe upper and lower lips swelling due to several areas of cold sores, just now visibly improving today after making appt yesterday; has tried abreva and blistex as well as valtrex course from [facility] 3 days ago.  Pt denies chest pain, increased sob or doe, wheezing, orthopnea, PND, increased LE swelling, palpitations, dizziness or syncope. Past Medical History:  Diagnosis Date  . Allergy   . Depression   . Endometriosis determined by laparoscopy 1993  . GERD (gastroesophageal reflux disease)   . Hypertension    Past Surgical History:  Procedure Laterality Date  . LAPAROSCOPIC OVARIAN CYSTECTOMY  1993    reports that she has quit smoking. Her smoking use included Cigarettes. She has never used smokeless tobacco. She reports that she drinks alcohol. She reports that she does not use drugs. family history includes Alcohol abuse in her father and mother; Cancer in her mother; Diabetes in her father; Drug abuse in her mother; Kidney disease in her maternal grandmother; Stroke in her maternal grandmother. Allergies  Allergen Reactions  . Amoxicillin-Pot Clavulanate Rash  . Tetracycline Rash   Current Outpatient Prescriptions on File Prior to Visit  Medication Sig Dispense Refill  . ALPRAZolam (XANAX) 0.5 MG tablet TAKE 1 TABLET BY MOUTH TWICE A DAY AS NEEDED 60 tablet 0  . fexofenadine (ALLEGRA ALLERGY) 180 MG tablet Take 1 tablet (180 mg total) by mouth daily. 30 tablet 5  . FLUoxetine (PROZAC) 40 MG capsule Take 1 capsule (40 mg total) by mouth daily. 30 capsule 11  . fluticasone (FLONASE) 50 MCG/ACT nasal spray PLACE 2 SPRAYS INTO BOTH NOSTRILS DAILY.-NOT COVERED-CHEAPER OTC-TRIED TO CALL 16 g 2  . ibuprofen (ADVIL,MOTRIN) 200 MG tablet Take by mouth.    . JUNEL 1/20 1-20 MG-MCG tablet TAKE 1 TABLET BY MOUTH DAILY, TAKE CONTINUOUSLY 21 tablet 5  .  lisinopril (PRINIVIL,ZESTRIL) 20 MG tablet Take 1 tablet (20 mg total) by mouth daily. 30 tablet 11  . Omega-3 Fatty Acids (FISH OIL) 1000 MG CAPS Take 2 capsules by mouth 2 (two) times daily.     No current facility-administered medications on file prior to visit.    Review of Systems All otherwise neg per pt    Objective:   Physical Exam BP (!) 140/92   Pulse 79   Ht 5\' 7"  (1.702 m)   Wt 205 lb (93 kg)   SpO2 98%   BMI 32.11 kg/m  VS noted, not ill appearing Constitutional: Pt appears in NAD HENT: Head: NCAT.  Right Ear: External ear normal.  Left Ear: External ear normal.  Eyes: . Pupils are equal, round, and reactive to light. Conjunctivae and EOM are normal Nose: without d/c or deformity Lips with mild diffuse swelling less than yesterday she shows on her cell phone picture, with crusted lesions Neck: Neck supple. Gross normal ROM Cardiovascular: Normal rate and regular rhythm.   Pulmonary/Chest: Effort normal and breath sounds without rales or wheezing.  Neurological: Pt is alert. At baseline orientation, motor grossly intact Skin: Skin is warm. No rashes, other new lesions, no LE edema Psychiatric: Pt behavior is normal without agitation , 1+ nervous No other exam findings    Assessment & Plan:

## 2016-12-07 NOTE — Telephone Encounter (Signed)
Pt has appt with Dr Oliver BarreJames John 12/07/16 at 3:15.

## 2016-12-07 NOTE — Telephone Encounter (Signed)
Patient Name: Melissa RathkeGRAZYNA Cowan DOB: Nov 13, 1965 Initial Comment Caller states she has cold sores, gone for a while, Friday flare up with lips swollen, went to [facility], zovarex cream and valtrex 1g oral. Been taking Benadryl and cold compresses, offices are full, and wondering what to do. She is wanting to do a suave to make sure this is what it is. Nurse Assessment Nurse: Reed Pandyamsey, RN, Amy Date/Time Lamount Cohen(Eastern Time): 12/07/2016 9:40:09 AM Confirm and document reason for call. If symptomatic, describe symptoms. ---Caller states she has cold sores that are full of pus and they are bleeding, lips are cracked. She went to urgent care 2 days ago and was put on a cream and Valtrex, says her symptoms are not getting better. Had swelling throughout her cheeks yesterday but nothing today. She says there were no appointments available and she's wanting to know what to do and if a swab should be done? Does the patient have any new or worsening symptoms? ---Yes Will a triage be completed? ---Yes Related visit to physician within the last 2 weeks? ---Yes Does the PT have any chronic conditions? (i.e. diabetes, asthma, etc.) ---Yes List chronic conditions. ---HTN Is the patient pregnant or possibly pregnant? (Ask all females between the ages of 3912-55) ---No Is this a behavioral health or substance abuse call? ---No Guidelines Guideline Title Affirmed Question Affirmed Notes Cold Sores (Fever Blisters) [1] Red streak or red area spreading from cold sore AND [2] no fever Final Disposition User See Physician within 24 Hours Ramsey, RN, Amy Referrals GO TO FACILITY OTHER - SPECIFY Disagree/Comply: Comply NO appt available with PCP or primary office, appt scheduled with Dr. Jonny RuizJohn at [facility] at 3:15p

## 2016-12-07 NOTE — Patient Instructions (Signed)
Ok to continue current treatment  Please take all new medication as prescribed - the valtrex for future attacks  Please continue all other medications as before, and refills have been done if requested.  Please have the pharmacy call with any other refills you may need.  Please keep your appointments with your specialists as you may have planned

## 2016-12-08 ENCOUNTER — Ambulatory Visit: Payer: Managed Care, Other (non HMO) | Admitting: Psychology

## 2016-12-08 DIAGNOSIS — B002 Herpesviral gingivostomatitis and pharyngotonsillitis: Secondary | ICD-10-CM | POA: Insufficient documentation

## 2016-12-08 NOTE — Assessment & Plan Note (Signed)
Today's exam now c/w improvement, to finish current valtrex, refills given for any future attacks but may not be needed

## 2016-12-08 NOTE — Assessment & Plan Note (Signed)
stable overall by history and exam, recent data reviewed with pt, and pt to continue medical treatment as before,  to f/u any worsening symptoms or concerns BP Readings from Last 3 Encounters:  12/07/16 (!) 140/92  05/24/16 122/82  03/03/16 132/84

## 2016-12-20 ENCOUNTER — Other Ambulatory Visit: Payer: Self-pay | Admitting: Internal Medicine

## 2016-12-21 ENCOUNTER — Other Ambulatory Visit: Payer: Self-pay | Admitting: Internal Medicine

## 2016-12-21 NOTE — Telephone Encounter (Signed)
Call pt:  How would she feel about trying to wean back on the Xanax to take it 1 x day prn, instead of 1-2 x day prn?

## 2016-12-21 NOTE — Telephone Encounter (Signed)
Last filled 11/16/16... Please advise 

## 2016-12-22 NOTE — Telephone Encounter (Signed)
Rx called in to pharmacy. 

## 2016-12-22 NOTE — Telephone Encounter (Signed)
Ok to phone in Xanax 

## 2016-12-22 NOTE — Telephone Encounter (Signed)
Pt states that she is agreeable to taking 1 daily vs 1-2 times daily

## 2017-01-05 ENCOUNTER — Ambulatory Visit: Payer: Managed Care, Other (non HMO) | Admitting: Psychology

## 2017-01-26 ENCOUNTER — Other Ambulatory Visit: Payer: Self-pay | Admitting: Internal Medicine

## 2017-01-27 NOTE — Telephone Encounter (Signed)
Last filled 12/22/16... Please advise

## 2017-01-27 NOTE — Telephone Encounter (Signed)
Ok to phone in Xanax 

## 2017-01-27 NOTE — Telephone Encounter (Signed)
Rx called in to pharmacy. 

## 2017-02-02 ENCOUNTER — Ambulatory Visit (INDEPENDENT_AMBULATORY_CARE_PROVIDER_SITE_OTHER): Payer: Managed Care, Other (non HMO) | Admitting: Psychology

## 2017-02-02 DIAGNOSIS — F331 Major depressive disorder, recurrent, moderate: Secondary | ICD-10-CM

## 2017-02-14 ENCOUNTER — Other Ambulatory Visit: Payer: Self-pay | Admitting: Internal Medicine

## 2017-02-25 ENCOUNTER — Other Ambulatory Visit: Payer: Self-pay | Admitting: Internal Medicine

## 2017-02-25 NOTE — Telephone Encounter (Signed)
Rx called in to pharmacy. 

## 2017-02-25 NOTE — Telephone Encounter (Signed)
Last filled 01/27/17... Please advise 

## 2017-02-25 NOTE — Telephone Encounter (Signed)
Ok to phone in Xanax 

## 2017-03-16 ENCOUNTER — Ambulatory Visit (INDEPENDENT_AMBULATORY_CARE_PROVIDER_SITE_OTHER): Payer: Managed Care, Other (non HMO) | Admitting: Psychology

## 2017-03-16 DIAGNOSIS — F331 Major depressive disorder, recurrent, moderate: Secondary | ICD-10-CM

## 2017-03-19 ENCOUNTER — Other Ambulatory Visit: Payer: Self-pay | Admitting: Internal Medicine

## 2017-03-25 ENCOUNTER — Other Ambulatory Visit: Payer: Self-pay

## 2017-03-25 MED ORDER — FLUOXETINE HCL 40 MG PO CAPS: 40.0000 mg | ORAL_CAPSULE | Freq: Every day | ORAL | 0 refills | Status: DC

## 2017-03-28 ENCOUNTER — Other Ambulatory Visit: Payer: Self-pay | Admitting: Internal Medicine

## 2017-03-29 NOTE — Telephone Encounter (Signed)
Last filled 02/25/17... Please advise 

## 2017-03-29 NOTE — Telephone Encounter (Signed)
Ok to phone in Xanax 

## 2017-03-29 NOTE — Telephone Encounter (Signed)
Rx called in to pharmacy. 

## 2017-03-31 ENCOUNTER — Ambulatory Visit (INDEPENDENT_AMBULATORY_CARE_PROVIDER_SITE_OTHER): Payer: Managed Care, Other (non HMO) | Admitting: Psychology

## 2017-03-31 DIAGNOSIS — F331 Major depressive disorder, recurrent, moderate: Secondary | ICD-10-CM | POA: Diagnosis not present

## 2017-04-13 ENCOUNTER — Ambulatory Visit (INDEPENDENT_AMBULATORY_CARE_PROVIDER_SITE_OTHER): Payer: Managed Care, Other (non HMO) | Admitting: Psychology

## 2017-04-13 DIAGNOSIS — F331 Major depressive disorder, recurrent, moderate: Secondary | ICD-10-CM | POA: Diagnosis not present

## 2017-04-14 ENCOUNTER — Other Ambulatory Visit: Payer: Self-pay

## 2017-04-14 LAB — HEMOGLOBIN A1C: Hemoglobin A1C: 5.1

## 2017-04-14 MED ORDER — FLUTICASONE PROPIONATE 50 MCG/ACT NA SUSP: 2.0000 | Freq: Every day | NASAL | 0 refills | Status: AC

## 2017-04-27 ENCOUNTER — Other Ambulatory Visit: Payer: Self-pay | Admitting: Internal Medicine

## 2017-04-28 ENCOUNTER — Other Ambulatory Visit: Payer: Self-pay | Admitting: Internal Medicine

## 2017-04-28 NOTE — Telephone Encounter (Signed)
Last filled 03/29/2017... Please advise

## 2017-04-29 NOTE — Telephone Encounter (Signed)
Ok to phone in Xanax 

## 2017-04-29 NOTE — Telephone Encounter (Signed)
Rx called in to pharmacy. 

## 2017-05-11 ENCOUNTER — Ambulatory Visit (INDEPENDENT_AMBULATORY_CARE_PROVIDER_SITE_OTHER): Payer: Managed Care, Other (non HMO) | Admitting: Psychology

## 2017-05-11 DIAGNOSIS — F331 Major depressive disorder, recurrent, moderate: Secondary | ICD-10-CM | POA: Diagnosis not present

## 2017-05-31 ENCOUNTER — Other Ambulatory Visit: Payer: Self-pay | Admitting: Internal Medicine

## 2017-05-31 NOTE — Telephone Encounter (Signed)
Last filled 04/29/17... Please advise 

## 2017-05-31 NOTE — Telephone Encounter (Signed)
Ok to phone in Xanax 

## 2017-06-01 NOTE — Telephone Encounter (Signed)
Rx called in to pharmacy. 

## 2017-06-02 ENCOUNTER — Encounter: Payer: Self-pay | Admitting: Internal Medicine

## 2017-06-02 ENCOUNTER — Ambulatory Visit (INDEPENDENT_AMBULATORY_CARE_PROVIDER_SITE_OTHER): Payer: Managed Care, Other (non HMO) | Admitting: Internal Medicine

## 2017-06-02 VITALS — BP 128/84 | HR 85 | Temp 98.1°F | Ht 67.0 in | Wt 213.0 lb

## 2017-06-02 DIAGNOSIS — I10 Essential (primary) hypertension: Secondary | ICD-10-CM

## 2017-06-02 DIAGNOSIS — N809 Endometriosis, unspecified: Secondary | ICD-10-CM

## 2017-06-02 DIAGNOSIS — E559 Vitamin D deficiency, unspecified: Secondary | ICD-10-CM | POA: Diagnosis not present

## 2017-06-02 DIAGNOSIS — K219 Gastro-esophageal reflux disease without esophagitis: Secondary | ICD-10-CM

## 2017-06-02 DIAGNOSIS — F329 Major depressive disorder, single episode, unspecified: Secondary | ICD-10-CM

## 2017-06-02 DIAGNOSIS — N951 Menopausal and female climacteric states: Secondary | ICD-10-CM | POA: Diagnosis not present

## 2017-06-02 DIAGNOSIS — B002 Herpesviral gingivostomatitis and pharyngotonsillitis: Secondary | ICD-10-CM | POA: Diagnosis not present

## 2017-06-02 DIAGNOSIS — Z13220 Encounter for screening for lipoid disorders: Secondary | ICD-10-CM

## 2017-06-02 DIAGNOSIS — F32A Depression, unspecified: Secondary | ICD-10-CM

## 2017-06-02 DIAGNOSIS — Z0000 Encounter for general adult medical examination without abnormal findings: Secondary | ICD-10-CM

## 2017-06-02 DIAGNOSIS — F419 Anxiety disorder, unspecified: Secondary | ICD-10-CM | POA: Diagnosis not present

## 2017-06-02 LAB — CBC
HEMATOCRIT: 36.7 % (ref 36.0–46.0)
Hemoglobin: 11.8 g/dL — ABNORMAL LOW (ref 12.0–15.0)
MCHC: 32.3 g/dL (ref 30.0–36.0)
MCV: 88.8 fl (ref 78.0–100.0)
Platelets: 284 10*3/uL (ref 150.0–400.0)
RBC: 4.13 Mil/uL (ref 3.87–5.11)
RDW: 13.6 % (ref 11.5–15.5)
WBC: 7.4 10*3/uL (ref 4.0–10.5)

## 2017-06-02 LAB — COMPREHENSIVE METABOLIC PANEL WITH GFR
ALT: 16 U/L (ref 0–35)
AST: 15 U/L (ref 0–37)
Albumin: 4 g/dL (ref 3.5–5.2)
Alkaline Phosphatase: 58 U/L (ref 39–117)
BUN: 12 mg/dL (ref 6–23)
CO2: 27 meq/L (ref 19–32)
Calcium: 8.9 mg/dL (ref 8.4–10.5)
Chloride: 103 meq/L (ref 96–112)
Creatinine, Ser: 0.64 mg/dL (ref 0.40–1.20)
GFR: 104.8 mL/min (ref >60.00)
Glucose, Bld: 76 mg/dL (ref 70–99)
Potassium: 4.4 meq/L (ref 3.5–5.1)
Sodium: 137 meq/L (ref 135–145)
Total Bilirubin: 0.2 mg/dL (ref 0.2–1.2)
Total Protein: 6.4 g/dL (ref 6.0–8.3)

## 2017-06-02 LAB — LIPID PANEL
Cholesterol: 167 mg/dL (ref 0–200)
HDL: 59.6 mg/dL (ref >39.00)
LDL Cholesterol: 75 mg/dL (ref 0–99)
NonHDL: 107.3
Total CHOL/HDL Ratio: 3
Triglycerides: 161 mg/dL — ABNORMAL HIGH (ref 0.0–149.0)
VLDL: 32.2 mg/dL (ref 0.0–40.0)

## 2017-06-02 LAB — LUTEINIZING HORMONE: LH: 14.09 m[IU]/mL

## 2017-06-02 LAB — VITAMIN D 25 HYDROXY (VIT D DEFICIENCY, FRACTURES): VITD: 22.14 ng/mL — ABNORMAL LOW (ref 30.00–100.00)

## 2017-06-02 LAB — FOLLICLE STIMULATING HORMONE: FSH: 10.2 m[IU]/mL

## 2017-06-02 NOTE — Patient Instructions (Signed)

## 2017-06-02 NOTE — Progress Notes (Signed)
Subjective:    Patient ID: Melissa RathkeGrazyna Cowan, female    DOB: 1966/02/17, 51 y.o.   MRN: [id number]  HPI  Pt presents to the clinic today for her annual exam. She is also due to follow up chronic conditions.  HTN: Her BP today is 128/84. She is taking her Lisinopril daily as prescribed. ECG from 12/2014 reviewed.  Anxiety and Depression: Chronic but stable on Fluoxetine. She takes Xanax only a few times per month. She denies SI/HI.  GERD: Intermittent. Triggered by spicy foods. She is not taking anything OTC for reflux at this time, but tries to control symptoms with her diet.  Endometriosis: Controlled with OCP's. She would like her hormones checked today to see if she is menopausal.   Oral Herpes: She takes Valtrex as needed. She denies recent outbreak.  Flu: 03/2017 Tetanus: 11/2013 Pap Smear: 04/2016 Mammogram: 05/2016 Vision Screening: as needed Dentist: biannually  Diet: She does eat meat. She consumes fruits and veggies daily. She does eat fried foods. She drinks sweet tea, some water. Exercise: None.  Review of Systems      Past Medical History:  Diagnosis Date  . Allergy   . Depression   . Endometriosis determined by laparoscopy 1993  . GERD (gastroesophageal reflux disease)   . Hypertension     Current Outpatient Medications  Medication Sig Dispense Refill  . acyclovir ointment (ZOVIRAX) 5 % Apply 1 application topically every 3 (three) hours.    . ALPRAZolam (XANAX) 0.5 MG tablet TAKE 1 TABLET BY MOUTH DAILY IF NEEDED 30 tablet 0  . fexofenadine (ALLEGRA) 180 MG tablet TAKE 1 TABLET (180 MG TOTAL) BY MOUTH DAILY. 30 tablet 5  . FLUoxetine (PROZAC) 40 MG capsule Take 1 capsule (40 mg total) by mouth daily. ANNUAL EXAM DUE December 2018 90 capsule 0  . fluticasone (FLONASE) 50 MCG/ACT nasal spray Place 2 sprays into both nostrils daily. 16 g 0  . ibuprofen (ADVIL,MOTRIN) 200 MG tablet Take by mouth.    Marland Kitchen. lisinopril (PRINIVIL,ZESTRIL) 20 MG tablet Take 1 tablet  (20 mg total) by mouth daily. 30 tablet 11  . norethindrone-ethinyl estradiol (JUNEL 1/20) 1-20 MG-MCG tablet Take 1 tablet by mouth daily. MUST SCHEDULE ANNUAL EXAM 21 tablet 1  . valACYclovir (VALTREX) 1000 MG tablet Take 1 tablet (1,000 mg total) by mouth 3 (three) times daily. 21 tablet 2  . Omega-3 Fatty Acids (FISH OIL) 1000 MG CAPS Take 2 capsules by mouth 2 (two) times daily.     No current facility-administered medications for this visit.     Allergies  Allergen Reactions  . Amoxicillin-Pot Clavulanate Rash  . Tetracycline Rash    Family History  Problem Relation Age of Onset  . Alcohol abuse Mother   . Drug abuse Mother   . Cancer Mother        uterine  . Alcohol abuse Father   . Diabetes Father   . Stroke Maternal Grandmother   . Kidney disease Maternal Grandmother     Social History   Socioeconomic History  . Marital status: Single    Spouse name: Not on file  . Number of children: Not on file  . Years of education: Not on file  . Highest education level: Not on file  Social Needs  . Financial resource strain: Not on file  . Food insecurity - worry: Not on file  . Food insecurity - inability: Not on file  . Transportation needs - medical: Not on file  . Transportation needs -  non-medical: Not on file  Occupational History  . Not on file  Tobacco Use  . Smoking status: Former Smoker    Types: Cigarettes  . Smokeless tobacco: Never Used  . Tobacco comment: quit x 15 years ago  Substance and Sexual Activity  . Alcohol use: Yes    Alcohol/week: 0.0 oz    Comment: social  . Drug use: No  . Sexual activity: Not Currently  Other Topics Concern  . Not on file  Social History Narrative  . Not on file     Constitutional: Denies fever, malaise, fatigue, headache or abrupt weight changes.  HEENT: Denies eye pain, eye redness, ear pain, ringing in the ears, wax buildup, runny nose, nasal congestion, bloody nose, or sore throat. Respiratory: Denies  difficulty breathing, shortness of breath, cough or sputum production.   Cardiovascular: Denies chest pain, chest tightness, palpitations or swelling in the hands or feet.  Gastrointestinal: Pt reports intermittent reflux. Denies abdominal pain, bloating, constipation, diarrhea or blood in the stool.  GU: Denies urgency, frequency, pain with urination, burning sensation, blood in urine, odor or discharge. Musculoskeletal: Denies decrease in range of motion, difficulty with gait, muscle pain or joint pain and swelling.  Skin: Denies redness, rashes, lesions or ulcercations.  Neurological: Denies dizziness, difficulty with memory, difficulty with speech or problems with balance and coordination.  Psych: Pt has a history of anxiety and depression. Denies SI/HI.  No other specific complaints in a complete review of systems (except as listed in HPI above).  Objective:   Physical Exam   BP 128/84   Pulse 85   Temp 98.1 F (36.7 C) (Oral)   Ht 5\' 7"  (1.702 m)   Wt 213 lb (96.6 kg)   SpO2 98%   BMI 33.36 kg/m   Wt Readings from Last 3 Encounters:  06/02/17 213 lb (96.6 kg)  12/07/16 205 lb (93 kg)  05/24/16 213 lb (96.6 kg)    General: Appears her stated age, obese in NAD. Skin: Warm, dry and intact.  HEENT: Head: normal shape and size; Eyes: sclera white, no icterus, conjunctiva pink, PERRLA and EOMs intact; Ears: Tm's gray and intact, normal light reflex; Throat/Mouth: Teeth present, mucosa pink and moist, no exudate, lesions or ulcerations noted.  Neck:  Neck supple, trachea midline. No masses, lumps or thyromegaly present.  Cardiovascular: Normal rate and rhythm. S1,S2 noted.  No murmur, rubs or gallops noted. No JVD or BLE edema.  Pulmonary/Chest: Normal effort and positive vesicular breath sounds. No respiratory distress. No wheezes, rales or ronchi noted.  Abdomen: Soft and nontender. Normal bowel sounds. No distention or masses noted. Liver, spleen and kidneys non  palpable. Musculoskeletal: Strength 5/5 BUE/BLE. No difficulty with gait.  Neurological: Alert and oriented. Cranial nerves II-XII grossly intact. Coordination normal.  Psychiatric: Mood and affect normal. Behavior is normal. Judgment and thought content normal.     BMET    Component Value Date/Time   NA 135 01/22/2015 1444   K 4.1 01/22/2015 1444   CL 102 01/22/2015 1444   CO2 27 01/22/2015 1444   GLUCOSE 74 01/22/2015 1444   BUN 11 01/22/2015 1444   CREATININE 0.70 01/22/2015 1444   CALCIUM 9.4 01/22/2015 1444    Lipid Panel     Component Value Date/Time   CHOL 178 01/22/2015 1444   TRIG 361.0 (H) 01/22/2015 1444   HDL 49.10 01/22/2015 1444   CHOLHDL 4 01/22/2015 1444   VLDL 72.2 (H) 01/22/2015 1444    CBC  Component Value Date/Time   WBC 7.7 01/22/2015 1444   RBC 4.14 01/22/2015 1444   HGB 11.9 (L) 01/22/2015 1444   HCT 35.9 (L) 01/22/2015 1444   PLT 336.0 01/22/2015 1444   MCV 86.6 01/22/2015 1444   MCHC 33.3 01/22/2015 1444   RDW 13.8 01/22/2015 1444    Hgb A1C Lab Results  Component Value Date   HGBA1C 5.4 01/22/2015           Assessment & Plan:   Preventative Health Maintenance:  Flu and tetanus UTD Pap smear and mammogram UTD Encouraged her to consume a balanced diet and exercise regimen Advised her to see an eye doctor and dentist annually Will check CBC, CMET, Lipid, Vit D, FSH and LH  Today  RTC in 1 year, sooner if needed Nicki ReaperBAITY, REGINA, NP

## 2017-06-03 ENCOUNTER — Other Ambulatory Visit: Payer: Self-pay | Admitting: Internal Medicine

## 2017-06-03 DIAGNOSIS — E559 Vitamin D deficiency, unspecified: Secondary | ICD-10-CM

## 2017-06-03 MED ORDER — VITAMIN D (ERGOCALCIFEROL) 1.25 MG (50000 UNIT) PO CAPS: 50000.0000 [IU] | ORAL_CAPSULE | ORAL | 0 refills | Status: DC

## 2017-06-09 ENCOUNTER — Encounter: Payer: Self-pay | Admitting: Internal Medicine

## 2017-06-09 DIAGNOSIS — N809 Endometriosis, unspecified: Secondary | ICD-10-CM | POA: Insufficient documentation

## 2017-06-09 NOTE — Assessment & Plan Note (Signed)
Continue Valtrex prn Will monitor

## 2017-06-09 NOTE — Assessment & Plan Note (Signed)
Diet controlled Discussed avoiding foods that can trigger her reflux Discussed how weight loss may help improve reflux CBC and CMET today

## 2017-06-09 NOTE — Assessment & Plan Note (Signed)
CBC and CMET today Reinforced DASH diet and exercise for weight loss Continue Lisinopril for now, will monitor

## 2017-06-09 NOTE — Assessment & Plan Note (Signed)
Chronic but stable on Fluoxetine and Xanax She needs repeat CSA and UDS

## 2017-06-09 NOTE — Assessment & Plan Note (Signed)
Controlled on OCP Will check FSH and LH today

## 2017-06-10 ENCOUNTER — Other Ambulatory Visit: Payer: Self-pay | Admitting: Internal Medicine

## 2017-06-17 ENCOUNTER — Other Ambulatory Visit: Payer: Self-pay | Admitting: Internal Medicine

## 2017-06-17 DIAGNOSIS — Z1231 Encounter for screening mammogram for malignant neoplasm of breast: Secondary | ICD-10-CM

## 2017-06-26 ENCOUNTER — Other Ambulatory Visit: Payer: Self-pay | Admitting: Internal Medicine

## 2017-06-29 ENCOUNTER — Other Ambulatory Visit: Payer: Self-pay | Admitting: Internal Medicine

## 2017-06-29 NOTE — Telephone Encounter (Signed)
Last filled 05/31/17... Please advise

## 2017-06-29 NOTE — Telephone Encounter (Signed)
Ok to phone in Xanax to be filled on or after 07/01/17

## 2017-06-30 ENCOUNTER — Other Ambulatory Visit: Payer: Self-pay | Admitting: Internal Medicine

## 2017-06-30 MED ORDER — FLUOXETINE HCL 40 MG PO CAPS: 40.0000 mg | ORAL_CAPSULE | Freq: Every day | ORAL | 2 refills | Status: DC

## 2017-06-30 NOTE — Telephone Encounter (Signed)
Rx called in to pharmacy. 

## 2017-07-01 ENCOUNTER — Encounter: Payer: Self-pay | Admitting: Internal Medicine

## 2017-07-12 ENCOUNTER — Ambulatory Visit
Admission: RE | Admit: 2017-07-12 | Discharge: 2017-07-12 | Disposition: A | Discharge status: Home / Self Care | Payer: Managed Care, Other (non HMO) | Source: Ambulatory Visit | Attending: Internal Medicine | Admitting: Internal Medicine

## 2017-07-12 DIAGNOSIS — Z1231 Encounter for screening mammogram for malignant neoplasm of breast: Secondary | ICD-10-CM

## 2017-07-14 ENCOUNTER — Other Ambulatory Visit: Payer: Self-pay | Admitting: Internal Medicine

## 2017-07-14 DIAGNOSIS — N6310 Unspecified lump in the right breast, unspecified quadrant: Secondary | ICD-10-CM

## 2017-07-14 DIAGNOSIS — R928 Other abnormal and inconclusive findings on diagnostic imaging of breast: Secondary | ICD-10-CM

## 2017-07-18 ENCOUNTER — Encounter: Payer: Self-pay | Admitting: Internal Medicine

## 2017-07-19 ENCOUNTER — Ambulatory Visit
Admission: RE | Admit: 2017-07-19 | Discharge: 2017-07-19 | Disposition: A | Discharge status: Home / Self Care | Payer: Managed Care, Other (non HMO) | Source: Ambulatory Visit | Attending: Internal Medicine | Admitting: Internal Medicine

## 2017-07-19 DIAGNOSIS — N6312 Unspecified lump in the right breast, upper inner quadrant: Secondary | ICD-10-CM | POA: Diagnosis not present

## 2017-07-19 DIAGNOSIS — R928 Other abnormal and inconclusive findings on diagnostic imaging of breast: Secondary | ICD-10-CM | POA: Insufficient documentation

## 2017-07-19 DIAGNOSIS — N6310 Unspecified lump in the right breast, unspecified quadrant: Secondary | ICD-10-CM

## 2017-07-20 ENCOUNTER — Ambulatory Visit: Payer: Managed Care, Other (non HMO) | Admitting: Psychology

## 2017-08-02 ENCOUNTER — Encounter: Payer: Self-pay | Admitting: Internal Medicine

## 2017-08-03 ENCOUNTER — Ambulatory Visit (INDEPENDENT_AMBULATORY_CARE_PROVIDER_SITE_OTHER): Payer: Managed Care, Other (non HMO) | Admitting: Internal Medicine

## 2017-08-03 ENCOUNTER — Encounter: Payer: Self-pay | Admitting: Internal Medicine

## 2017-08-03 VITALS — BP 130/80 | HR 76 | Temp 98.1°F | Wt 213.0 lb

## 2017-08-03 DIAGNOSIS — B373 Candidiasis of vulva and vagina: Secondary | ICD-10-CM

## 2017-08-03 DIAGNOSIS — B3731 Acute candidiasis of vulva and vagina: Secondary | ICD-10-CM

## 2017-08-03 DIAGNOSIS — N898 Other specified noninflammatory disorders of vagina: Secondary | ICD-10-CM

## 2017-08-03 MED ORDER — FLUCONAZOLE 150 MG PO TABS: 150.0000 mg | ORAL_TABLET | Freq: Once | ORAL | 0 refills | Status: AC

## 2017-08-03 NOTE — Patient Instructions (Signed)

## 2017-08-03 NOTE — Progress Notes (Signed)
Subjective:    Patient ID: Melissa Cowan, female    DOB: [birth year], 52 y.o.   MRN: [id number]  HPI  Pt presents to the clinic today with c/o vaginal irritation and itching. This started 2 weeks ago. She denies vaginal discharge, but does reports an abnormal odor, not foul or fishy. She denies abnormal bleeding, urinary frequency, dysuria or blood in her urine. She would like STD testing today.  Review of Systems  Past Medical History:  Diagnosis Date  . Allergy   . Depression   . Endometriosis determined by laparoscopy 1993  . GERD (gastroesophageal reflux disease)   . Hypertension     Current Outpatient Medications  Medication Sig Dispense Refill  . acyclovir ointment (ZOVIRAX) 5 % Apply 1 application topically every 3 (three) hours.    . ALPRAZolam (XANAX) 0.5 MG tablet TAKE 1 TABLET BY MOUTH EVERY DAY AS NEEDED 30 tablet 0  . fexofenadine (ALLEGRA) 180 MG tablet TAKE 1 TABLET (180 MG TOTAL) BY MOUTH DAILY. 30 tablet 5  . FLUoxetine (PROZAC) 40 MG capsule Take 1 capsule (40 mg total) by mouth daily. 90 capsule 2  . fluticasone (FLONASE) 50 MCG/ACT nasal spray Place 2 sprays into both nostrils daily. 16 g 0  . ibuprofen (ADVIL,MOTRIN) 200 MG tablet Take by mouth.    Marland Kitchen lisinopril (PRINIVIL,ZESTRIL) 20 MG tablet TAKE 1 TABLET (20 MG TOTAL) BY MOUTH DAILY. 30 tablet 11  . norethindrone-ethinyl estradiol (JUNEL 1/20) 1-20 MG-MCG tablet Take 1 tablet by mouth daily. 21 tablet 10  . Omega-3 Fatty Acids (FISH OIL) 1000 MG CAPS Take 2 capsules by mouth 2 (two) times daily.    . valACYclovir (VALTREX) 1000 MG tablet Take 1 tablet (1,000 mg total) by mouth 3 (three) times daily. 21 tablet 2  . Vitamin D, Ergocalciferol, (DRISDOL) 50000 units CAPS capsule Take 1 capsule (50,000 Units total) by mouth every 7 (seven) days. 12 capsule 0   No current facility-administered medications for this visit.     Allergies  Allergen Reactions  . Amoxicillin-Pot Clavulanate Rash  . Tetracycline  Rash    Family History  Problem Relation Age of Onset  . Alcohol abuse Mother   . Drug abuse Mother   . Cancer Mother        uterine  . Alcohol abuse Father   . Diabetes Father   . Stroke Maternal Grandmother   . Kidney disease Maternal Grandmother     Social History   Socioeconomic History  . Marital status: Divorced    Spouse name: Not on file  . Number of children: Not on file  . Years of education: Not on file  . Highest education level: Not on file  Social Needs  . Financial resource strain: Not on file  . Food insecurity - worry: Not on file  . Food insecurity - inability: Not on file  . Transportation needs - medical: Not on file  . Transportation needs - non-medical: Not on file  Occupational History  . Not on file  Tobacco Use  . Smoking status: Former Smoker    Types: Cigarettes  . Smokeless tobacco: Never Used  . Tobacco comment: quit x 15 years ago  Substance and Sexual Activity  . Alcohol use: Yes    Alcohol/week: 0.0 oz    Comment: social  . Drug use: No  . Sexual activity: Not Currently  Other Topics Concern  . Not on file  Social History Narrative  . Not on file  Constitutional: Denies fever, malaise, fatigue, headache or abrupt weight changes.  Gastrointestinal: Denies abdominal pain, bloating, constipation, diarrhea or blood in the stool.  GU: Pt reports vaginal itching and irritation. Denies urgency, frequency, pain with urination, burning sensation, blood in urine,  or discharge.  No other specific complaints in a complete review of systems (except as listed in HPI above).     Objective:   Physical Exam  BP 130/80   Pulse 76   Temp 98.1 F (36.7 C) (Oral)   Wt 213 lb (96.6 kg)   SpO2 98%   BMI 33.36 kg/m  Wt Readings from Last 3 Encounters:  08/03/17 213 lb (96.6 kg)  06/02/17 213 lb (96.6 kg)  12/07/16 205 lb (93 kg)    General: Appears her stated age, in NAD. Abdomen: Soft and nontender. Normal bowel sounds.  Pelvic:  No irritation or discharge noted.  BMET    Component Value Date/Time   NA 137 06/02/2017 1232   K 4.4 06/02/2017 1232   CL 103 06/02/2017 1232   CO2 27 06/02/2017 1232   GLUCOSE 76 06/02/2017 1232   BUN 12 06/02/2017 1232   CREATININE 0.64 06/02/2017 1232   CALCIUM 8.9 06/02/2017 1232    Lipid Panel     Component Value Date/Time   CHOL 167 06/02/2017 1232   TRIG 161.0 (H) 06/02/2017 1232   HDL 59.60 06/02/2017 1232   CHOLHDL 3 06/02/2017 1232   VLDL 32.2 06/02/2017 1232   LDLCALC 75 06/02/2017 1232    CBC    Component Value Date/Time   WBC 7.4 06/02/2017 1232   RBC 4.13 06/02/2017 1232   HGB 11.8 (L) 06/02/2017 1232   HCT 36.7 06/02/2017 1232   PLT 284.0 06/02/2017 1232   MCV 88.8 06/02/2017 1232   MCHC 32.3 06/02/2017 1232   RDW 13.6 06/02/2017 1232    Hgb A1C Lab Results  Component Value Date   HGBA1C 5.4 01/22/2015            Assessment & Plan:   Vaginal Irritation and Itching, secondary to Vaginal Candidiasis:  Wet prep: + yeast Will send off wet prep as well Urine G/C gen probe eRx for Diflucan 150 mg PO x 1, repeat in 3 days if needed  Return precautions discussed Nicki ReaperBAITY, Oneda Duffett, NP

## 2017-08-04 ENCOUNTER — Other Ambulatory Visit: Payer: Self-pay | Admitting: Internal Medicine

## 2017-08-04 LAB — WET PREP BY MOLECULAR PROBE
CANDIDA SPECIES: DETECTED — AB
MICRO NUMBER:: 90160664
SPECIMEN QUALITY:: ADEQUATE
Trichomonas vaginosis: NOT DETECTED

## 2017-08-04 LAB — C. TRACHOMATIS/N. GONORRHOEAE RNA
C. trachomatis RNA, TMA: NOT DETECTED
N. gonorrhoeae RNA, TMA: NOT DETECTED

## 2017-08-04 MED ORDER — METRONIDAZOLE 0.75 % VA GEL: 1.0000 | Freq: Two times a day (BID) | VAGINAL | 0 refills | Status: DC

## 2017-08-05 ENCOUNTER — Encounter: Payer: Self-pay | Admitting: Internal Medicine

## 2017-08-08 ENCOUNTER — Encounter: Payer: Self-pay | Admitting: Internal Medicine

## 2017-08-08 MED ORDER — ALPRAZOLAM 0.25 MG PO TABS: 0.2500 mg | ORAL_TABLET | Freq: Every day | ORAL | 0 refills | Status: DC | PRN

## 2017-08-09 ENCOUNTER — Ambulatory Visit: Payer: Managed Care, Other (non HMO) | Admitting: Psychology

## 2017-08-17 ENCOUNTER — Ambulatory Visit: Payer: Managed Care, Other (non HMO) | Admitting: Psychology

## 2017-08-21 ENCOUNTER — Other Ambulatory Visit: Payer: Self-pay | Admitting: Internal Medicine

## 2017-08-24 ENCOUNTER — Ambulatory Visit (INDEPENDENT_AMBULATORY_CARE_PROVIDER_SITE_OTHER): Payer: Managed Care, Other (non HMO) | Admitting: Family Medicine

## 2017-08-24 ENCOUNTER — Encounter: Payer: Self-pay | Admitting: Family Medicine

## 2017-08-24 VITALS — BP 132/84 | HR 78 | Temp 99.3°F | Wt 212.5 lb

## 2017-08-24 DIAGNOSIS — J0100 Acute maxillary sinusitis, unspecified: Secondary | ICD-10-CM

## 2017-08-24 MED ORDER — FLUCONAZOLE 150 MG PO TABS: 150.0000 mg | ORAL_TABLET | Freq: Once | ORAL | 0 refills | Status: AC

## 2017-08-24 MED ORDER — AZITHROMYCIN 250 MG PO TABS: ORAL_TABLET | ORAL | 0 refills | Status: DC

## 2017-08-24 NOTE — Progress Notes (Signed)
Subjective:    Patient ID: Melissa Cowan, female    DOB: 11/01/1965, 52 y.o.   MRN: [id number]  HPI This is a 52 yo female who presents today with nasal congestion, sinus pressure, left sided headache. Has been sick for 10 days, over last 2 days, sore throat, ear pressure. Clear to yellow nasal drainage. Takes allegra, sudafed, Alka seltzer and flonase with little relief. Also uses saline nasal spray. Felt better then worse. Good fluid intake. Little dry cough, no fever.   Past Medical History:  Diagnosis Date  . Allergy   . Depression   . Endometriosis determined by laparoscopy 1993  . GERD (gastroesophageal reflux disease)   . Hypertension    Past Surgical History:  Procedure Laterality Date  . LAPAROSCOPIC OVARIAN CYSTECTOMY  1993   Family History  Problem Relation Age of Onset  . Alcohol abuse Mother   . Drug abuse Mother   . Cancer Mother        uterine  . Alcohol abuse Father   . Diabetes Father   . Stroke Maternal Grandmother   . Kidney disease Maternal Grandmother    Social History   Tobacco Use  . Smoking status: Former Smoker    Types: Cigarettes  . Smokeless tobacco: Never Used  . Tobacco comment: quit x 15 years ago  Substance Use Topics  . Alcohol use: Yes    Alcohol/week: 0.0 oz    Comment: social  . Drug use: No    Review of Systems Per HPI    Objective:   Physical Exam  Constitutional: She is oriented to person, place, and time. She appears well-developed and well-nourished.  HENT:  Head: Normocephalic and atraumatic.  Right Ear: Tympanic membrane, external ear and ear canal normal.  Left Ear: External ear normal. A middle ear effusion is present.  Nose: Mucosal edema, rhinorrhea and septal deviation present. Right sinus exhibits no maxillary sinus tenderness and no frontal sinus tenderness. Left sinus exhibits maxillary sinus tenderness. Left sinus exhibits no frontal sinus tenderness.  Mouth/Throat: Oropharynx is clear and moist.  Eyes:  Conjunctivae are normal.  Neck: Normal range of motion. Neck supple.  Cardiovascular: Normal rate, regular rhythm and normal heart sounds.  Pulmonary/Chest: Effort normal and breath sounds normal.  Lymphadenopathy:    She has no cervical adenopathy.  Neurological: She is alert and oriented to person, place, and time.  Skin: Skin is warm and dry. She is not diaphoretic.  Psychiatric: She has a normal mood and affect. Her behavior is normal. Judgment and thought content normal.  Vitals reviewed.     BP 132/84   Pulse 78   Temp 99.3 F (37.4 C) (Oral)   Wt 212 lb 8 oz (96.4 kg)   SpO2 97%   BMI 33.28 kg/m  Wt Readings from Last 3 Encounters:  08/24/17 212 lb 8 oz (96.4 kg)  08/03/17 213 lb (96.6 kg)  06/02/17 213 lb (96.6 kg)       Assessment & Plan:  1. Acute non-recurrent maxillary sinusitis - given length of symptoms, rebound pattern, will treat for bacterial infection - azithromycin (ZITHROMAX) 250 MG tablet; Take 2 tabs PO x 1 dose, then 1 tab PO QD x 4 days  Dispense: 6 tablet; Refill: 0 - Provided written and verbal information regarding diagnosis and treatment. -  Patient Instructions  For nasal congestion you can use Afrin nasal spray for 3 days max, Sudafed, saline nasal spray (generic is fine for all). For cough you can try  Delsym. Drink enough fluids to make your urine light yellow. For fever/chill/muscle aches you can take over the counter acetaminophen or ibuprofen.  Please come back in if you are not better in 5-7 days or if you develop wheezing, shortness of breath or persistent vomiting.    Olean Reeeborah Gessner, FNP-BC  [facility] [place], [facility]  08/24/2017 1:23 PM

## 2017-08-24 NOTE — Patient Instructions (Signed)
For nasal congestion you can use Afrin nasal spray for 3 days max, Sudafed, saline nasal spray (generic is fine for all). For cough you can try Delsym. Drink enough fluids to make your urine light yellow. For fever/chill/muscle aches you can take over the counter acetaminophen or ibuprofen.  Please come back in if you are not better in 5-7 days or if you develop wheezing, shortness of breath or persistent vomiting.   

## 2017-09-08 ENCOUNTER — Other Ambulatory Visit: Payer: Self-pay | Admitting: Internal Medicine

## 2017-09-12 NOTE — Telephone Encounter (Signed)
Baity pt, out of the office.Marland Kitchen.Marland Kitchen.Last filled 08/08/17... Please advise

## 2017-09-13 NOTE — Telephone Encounter (Signed)
Sent. Thanks.   

## 2017-10-17 ENCOUNTER — Other Ambulatory Visit: Payer: Self-pay | Admitting: Family Medicine

## 2017-10-18 NOTE — Telephone Encounter (Signed)
Electronic refill request Last refill 3/19/199 #[phone] Last office visit 08/24/17/acute

## 2017-10-21 ENCOUNTER — Ambulatory Visit (INDEPENDENT_AMBULATORY_CARE_PROVIDER_SITE_OTHER): Payer: Managed Care, Other (non HMO) | Admitting: Psychology

## 2017-10-21 DIAGNOSIS — F331 Major depressive disorder, recurrent, moderate: Secondary | ICD-10-CM | POA: Diagnosis not present

## 2017-10-28 ENCOUNTER — Encounter: Payer: Self-pay | Admitting: Internal Medicine

## 2017-11-01 ENCOUNTER — Encounter: Payer: Self-pay | Admitting: Internal Medicine

## 2017-11-01 ENCOUNTER — Ambulatory Visit (INDEPENDENT_AMBULATORY_CARE_PROVIDER_SITE_OTHER)
Admission: RE | Admit: 2017-11-01 | Discharge: 2017-11-01 | Disposition: A | Discharge status: Home / Self Care | Payer: Managed Care, Other (non HMO) | Source: Ambulatory Visit | Attending: Internal Medicine | Admitting: Internal Medicine

## 2017-11-01 ENCOUNTER — Ambulatory Visit (INDEPENDENT_AMBULATORY_CARE_PROVIDER_SITE_OTHER): Payer: Managed Care, Other (non HMO) | Admitting: Internal Medicine

## 2017-11-01 VITALS — BP 124/86 | HR 73 | Temp 97.9°F | Wt 216.0 lb

## 2017-11-01 DIAGNOSIS — M25541 Pain in joints of right hand: Secondary | ICD-10-CM

## 2017-11-01 DIAGNOSIS — M2550 Pain in unspecified joint: Secondary | ICD-10-CM

## 2017-11-01 DIAGNOSIS — M25542 Pain in joints of left hand: Secondary | ICD-10-CM | POA: Diagnosis not present

## 2017-11-01 LAB — SEDIMENTATION RATE: SED RATE: 20 mm/h (ref 0–20)

## 2017-11-01 LAB — HIGH SENSITIVITY CRP: CRP, High Sensitivity: 7.14 mg/L — ABNORMAL HIGH (ref 0.000–5.000)

## 2017-11-01 NOTE — Progress Notes (Signed)
Subjective:    Patient ID: Melissa Cowan, female    DOB: [birth year], 52 y.o.   MRN: [id number]  HPI  Pt presents to the clinic today to discuss joint pain. She reports over the last 6-7 months. The joints affected are mainly in knees, feet and hands, but it seems worse in her hands. She describes the pain as burning. She does have some associated weakness, numbness or tingling in her hands but no weakness, numbness or tingling in her legs. She has noticed some associated swelling of the joints in her hand. She has taken Tylenol Arthritis, Ibuprofen without any relief. Heat seems to help. She would like to be tested for rheumatoid arthritis today. She has no family history of autoimmune disorders.   Review of Systems  Past Medical History:  Diagnosis Date  . Allergy   . Depression   . Endometriosis determined by laparoscopy 1993  . GERD (gastroesophageal reflux disease)   . Hypertension     Current Outpatient Medications  Medication Sig Dispense Refill  . acyclovir ointment (ZOVIRAX) 5 % Apply 1 application topically every 3 (three) hours.    . ALPRAZolam (XANAX) 0.25 MG tablet TAKE 1 TABLET (0.25 MG TOTAL) BY MOUTH DAILY AS NEEDED FOR ANXIETY. 30 tablet 0  . azithromycin (ZITHROMAX) 250 MG tablet Take 2 tabs PO x 1 dose, then 1 tab PO QD x 4 days 6 tablet 0  . fexofenadine (ALLEGRA) 180 MG tablet TAKE 1 TABLET (180 MG TOTAL) BY MOUTH DAILY. 30 tablet 5  . FLUoxetine (PROZAC) 40 MG capsule Take 1 capsule (40 mg total) by mouth daily. 90 capsule 2  . fluticasone (FLONASE) 50 MCG/ACT nasal spray Place 2 sprays into both nostrils daily. 16 g 0  . ibuprofen (ADVIL,MOTRIN) 200 MG tablet Take by mouth.    Marland Kitchen lisinopril (PRINIVIL,ZESTRIL) 20 MG tablet TAKE 1 TABLET (20 MG TOTAL) BY MOUTH DAILY. 30 tablet 11  . metroNIDAZOLE (METROGEL VAGINAL) 0.75 % vaginal gel Place 1 Applicatorful vaginally 2 (two) times daily. 70 g 0  . norethindrone-ethinyl estradiol (JUNEL 1/20) 1-20 MG-MCG tablet Take 1  tablet by mouth daily. 21 tablet 10  . Omega-3 Fatty Acids (FISH OIL) 1000 MG CAPS Take 2 capsules by mouth 2 (two) times daily.    . valACYclovir (VALTREX) 1000 MG tablet Take 1 tablet (1,000 mg total) by mouth 3 (three) times daily. 21 tablet 2  . Vitamin D, Ergocalciferol, (DRISDOL) 50000 units CAPS capsule Take 1 capsule (50,000 Units total) by mouth every 7 (seven) days. 12 capsule 0   No current facility-administered medications for this visit.     Allergies  Allergen Reactions  . Amoxicillin-Pot Clavulanate Rash  . Tetracycline Rash    Family History  Problem Relation Age of Onset  . Alcohol abuse Mother   . Drug abuse Mother   . Cancer Mother        uterine  . Alcohol abuse Father   . Diabetes Father   . Stroke Maternal Grandmother   . Kidney disease Maternal Grandmother     Social History   Socioeconomic History  . Marital status: Divorced    Spouse name: Not on file  . Number of children: Not on file  . Years of education: Not on file  . Highest education level: Not on file  Occupational History  . Not on file  Social Needs  . Financial resource strain: Not on file  . Food insecurity:    Worry: Not on file  Inability: Not on file  . Transportation needs:    Medical: Not on file    Non-medical: Not on file  Tobacco Use  . Smoking status: Former Smoker    Types: Cigarettes  . Smokeless tobacco: Never Used  . Tobacco comment: quit x 15 years ago  Substance and Sexual Activity  . Alcohol use: Yes    Alcohol/week: 0.0 oz    Comment: social  . Drug use: No  . Sexual activity: Not Currently  Lifestyle  . Physical activity:    Days per week: Not on file    Minutes per session: Not on file  . Stress: Not on file  Relationships  . Social connections:    Talks on phone: Not on file    Gets together: Not on file    Attends religious service: Not on file    Active member of club or organization: Not on file    Attends meetings of clubs or  organizations: Not on file    Relationship status: Not on file  . Intimate partner violence:    Fear of current or ex partner: Not on file    Emotionally abused: Not on file    Physically abused: Not on file    Forced sexual activity: Not on file  Other Topics Concern  . Not on file  Social History Narrative  . Not on file     Constitutional: Denies fever, malaise, fatigue, headache or abrupt weight changes.  Musculoskeletal: Pt reports joint pain, swelling and weakness of upper extremities. Denies decrease in range of motion, difficulty with gait, muscle pain.  Skin: Denies redness, rashes, lesions or ulcercations.  Neurological: Pt reports numbness and tingling in hands. Denies  problems with balance and coordination.    No other specific complaints in a complete review of systems (except as listed in HPI above).     Objective:   Physical Exam  BP 124/86   Pulse 73   Temp 97.9 F (36.6 C) (Oral)   Wt 216 lb (98 kg)   SpO2 98%   BMI 33.83 kg/m  Wt Readings from Last 3 Encounters:  11/01/17 216 lb (98 kg)  08/24/17 212 lb 8 oz (96.4 kg)  08/03/17 213 lb (96.6 kg)    General: Appears her stated age, obese in NAD. Skin: Warm, dry and intact. No rashes noted. Cardiovascular: Distal pulses 2+ bilaterally. Musculoskeletal: Normal flexion and extension of the fingers. Some enlargement of the PIP joints of the 4th and 5th fingers on the right, but no joint swelling noted. Hand grips equal.  Normal flexion, extension of the knees. Normal flexion, extension and rotation of the ankles. No joint swelling noted. Strength 5/5 BLE. No difficulty with gait.  Neurological: Alert and oriented. Sensation intact to BUE/BLE. Negative Phalen's. Negative Tinel's.   BMET    Component Value Date/Time   NA 137 06/02/2017 1232   K 4.4 06/02/2017 1232   CL 103 06/02/2017 1232   CO2 27 06/02/2017 1232   GLUCOSE 76 06/02/2017 1232   BUN 12 06/02/2017 1232   CREATININE 0.64 06/02/2017 1232     CALCIUM 8.9 06/02/2017 1232    Lipid Panel     Component Value Date/Time   CHOL 167 06/02/2017 1232   TRIG 161.0 (H) 06/02/2017 1232   HDL 59.60 06/02/2017 1232   CHOLHDL 3 06/02/2017 1232   VLDL 32.2 06/02/2017 1232   LDLCALC 75 06/02/2017 1232    CBC    Component Value Date/Time   WBC  7.4 06/02/2017 1232   RBC 4.13 06/02/2017 1232   HGB 11.8 (L) 06/02/2017 1232   HCT 36.7 06/02/2017 1232   PLT 284.0 06/02/2017 1232   MCV 88.8 06/02/2017 1232   MCHC 32.3 06/02/2017 1232   RDW 13.6 06/02/2017 1232    Hgb A1C Lab Results  Component Value Date   HGBA1C 5.4 01/22/2015            Assessment & Plan:   Joint Pains- Hands, Feet, Knees:  Xray bilateral hands today Will check ESR, CRP, ANA, RF Encouraged activity, weight loss Continue NSAID's OTC  Will follow up after labs and xray to discuss results and definitive treatment plan Webb Silversmith, NP

## 2017-11-01 NOTE — Patient Instructions (Signed)
Joint Pain Joint pain can be caused by many things. The joint can be bruised, infected, weak from aging, or sore from exercise. The pain will probably go away if you follow your doctor's instructions for home care. If your joint pain continues, more tests may be needed to help find the cause of your condition. Follow these instructions at home: Watch your condition for any changes. Follow these instructions as told to lessen the pain that you are feeling:  Take medicines only as told by your doctor.  Rest the sore joint for as long as told by your doctor. If your doctor tells you to, raise (elevate) the painful joint above the level of your heart while you are sitting or lying down.  Do not do things that cause pain or make the pain worse.  If told, put ice on the painful area: ? Put ice in a plastic bag. ? Place a towel between your skin and the bag. ? Leave the ice on for 20 minutes, 2-3 times per day.  Wear an elastic bandage, splint, or sling as told by your doctor. Loosen the bandage or splint if your fingers or toes lose feeling (become numb) and tingle, or if they turn cold and blue.  Begin exercising or stretching the joint as told by your doctor. Ask your doctor what types of exercise are safe for you.  Keep all follow-up visits as told by your doctor. This is important.  Contact a doctor if:  Your pain gets worse and medicine does not help it.  Your joint pain does not get better in 3 days.  You have more bruising or swelling.  You have a fever.  You lose 10 pounds (4.5 kg) or more without trying. Get help right away if:  You are not able to move the joint.  Your fingers or toes become numb or they turn cold and blue. This information is not intended to replace advice given to you by your health care provider. Make sure you discuss any questions you have with your health care provider. Document Released: 06/02/2009 Document Revised: 11/20/2015 Document Reviewed:  03/26/2014 Elsevier Interactive Patient Education  2018 Elsevier Inc.  

## 2017-11-07 ENCOUNTER — Other Ambulatory Visit: Payer: Self-pay | Admitting: Internal Medicine

## 2017-11-07 ENCOUNTER — Encounter: Payer: Self-pay | Admitting: Internal Medicine

## 2017-11-07 DIAGNOSIS — R768 Other specified abnormal immunological findings in serum: Secondary | ICD-10-CM

## 2017-11-07 DIAGNOSIS — R7982 Elevated C-reactive protein (CRP): Secondary | ICD-10-CM

## 2017-11-07 DIAGNOSIS — M2550 Pain in unspecified joint: Secondary | ICD-10-CM

## 2017-11-07 LAB — TEST AUTHORIZATION

## 2017-11-07 LAB — ANTI-NUCLEAR AB-TITER (ANA TITER)

## 2017-11-07 LAB — RHEUMATOID FACTOR: Rhuematoid fact SerPl-aCnc: <14 IU/mL (ref <14)

## 2017-11-07 LAB — ANA: Anti Nuclear Antibody(ANA): POSITIVE — AB

## 2017-11-09 ENCOUNTER — Telehealth: Payer: Self-pay

## 2017-11-09 NOTE — Telephone Encounter (Signed)
Left message for patient to call Denia Mcvicar back in regards to a referral-Nevae Pinnix V Kamel Haven, RMA   

## 2017-11-09 NOTE — Telephone Encounter (Signed)
Pt returned call, said call her back on mobile [phone]

## 2017-11-09 NOTE — Telephone Encounter (Signed)
Spoke with patient. Patient will call me back to let me know if [facility] rheumatology providers in her network before setting up. Patient would like -Anastasiya V Hopkins, RMA

## 2017-11-10 NOTE — Telephone Encounter (Signed)
Spoke with patient. Sending referral over to [facility] Rheumatology for Dr Renard Matter, advised patient their office will call her directly to schedule after reviewing records. If they do not accept her as a patient will try a different office at that [employer], RMA

## 2017-11-10 NOTE — Telephone Encounter (Signed)
Pt returned call  - she says [facility] clinic  Dr Marvis Repress   She says retuen call to her  Work number   [phone]  [facility] to leave message

## 2017-11-17 ENCOUNTER — Other Ambulatory Visit: Payer: Self-pay | Admitting: Internal Medicine

## 2017-11-17 NOTE — Telephone Encounter (Signed)
Last filled 10/18/17... Please advise

## 2017-12-02 ENCOUNTER — Ambulatory Visit (INDEPENDENT_AMBULATORY_CARE_PROVIDER_SITE_OTHER): Payer: Managed Care, Other (non HMO) | Admitting: Psychology

## 2017-12-02 DIAGNOSIS — F331 Major depressive disorder, recurrent, moderate: Secondary | ICD-10-CM | POA: Diagnosis not present

## 2017-12-20 ENCOUNTER — Other Ambulatory Visit: Payer: Self-pay | Admitting: Internal Medicine

## 2017-12-20 NOTE — Telephone Encounter (Signed)
Last filled 11/17/2017... Please advise

## 2017-12-27 ENCOUNTER — Encounter: Payer: Self-pay | Admitting: Internal Medicine

## 2017-12-30 ENCOUNTER — Ambulatory Visit: Payer: Managed Care, Other (non HMO) | Admitting: Psychology

## 2018-01-24 ENCOUNTER — Other Ambulatory Visit: Payer: Self-pay | Admitting: Internal Medicine

## 2018-01-25 NOTE — Telephone Encounter (Signed)
Name of Medication: Xanax 0.25mg  Name of Pharmacy: [facility] Last Fill or Written Date and Quantity: 12/21/17 #30 Last Office Visit and Type: 10/2017--acute Next Office Visit and Type: none Last Controlled Substance Agreement Date: none

## 2018-01-27 ENCOUNTER — Ambulatory Visit: Payer: Managed Care, Other (non HMO) | Admitting: Psychology

## 2018-02-04 ENCOUNTER — Other Ambulatory Visit: Payer: Self-pay | Admitting: Internal Medicine

## 2018-02-12 IMAGING — MG MM DIGITAL DIAGNOSTIC UNILAT*R* W/ TOMO W/ CAD
6 series · 6 of 14 positions shown · non-contrast
Comparison: Previous exam(s).

CLINICAL DATA: Right breast upper inner quadrant possible mass seen
on most recent screening mammography.

EXAM:
2D DIGITAL DIAGNOSTIC UNILATERAL RIGHT MAMMOGRAM WITH CAD AND
ADJUNCT TOMO

[R MLO]
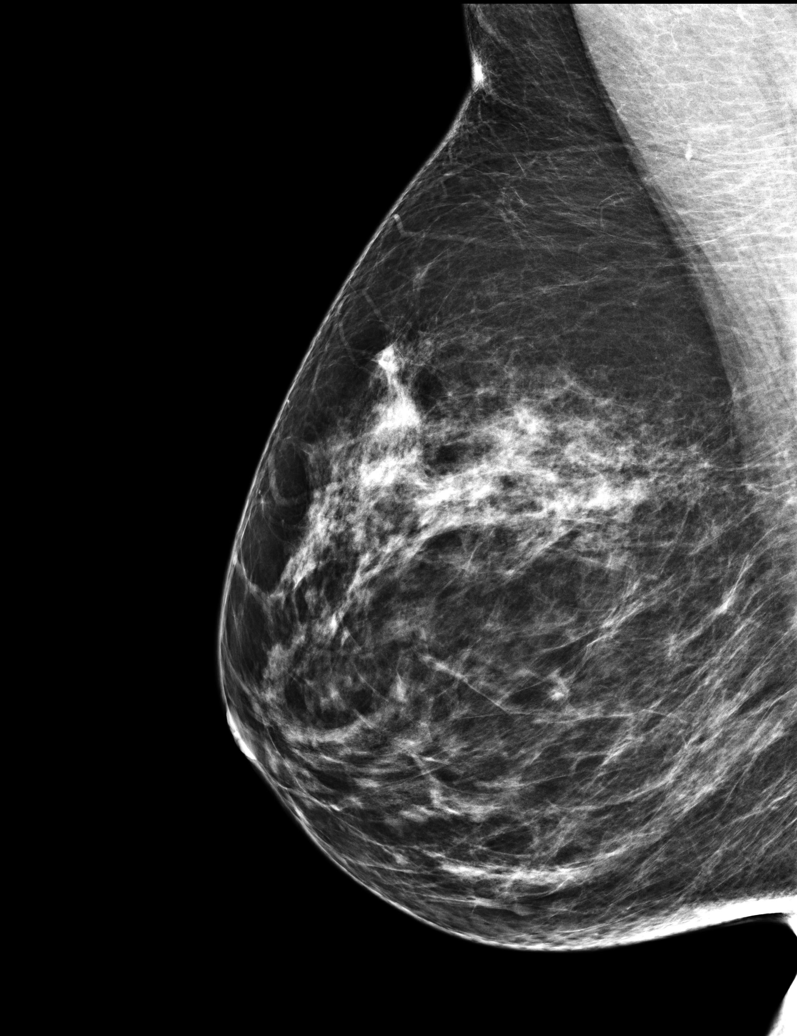

[R MLO synth-2D]
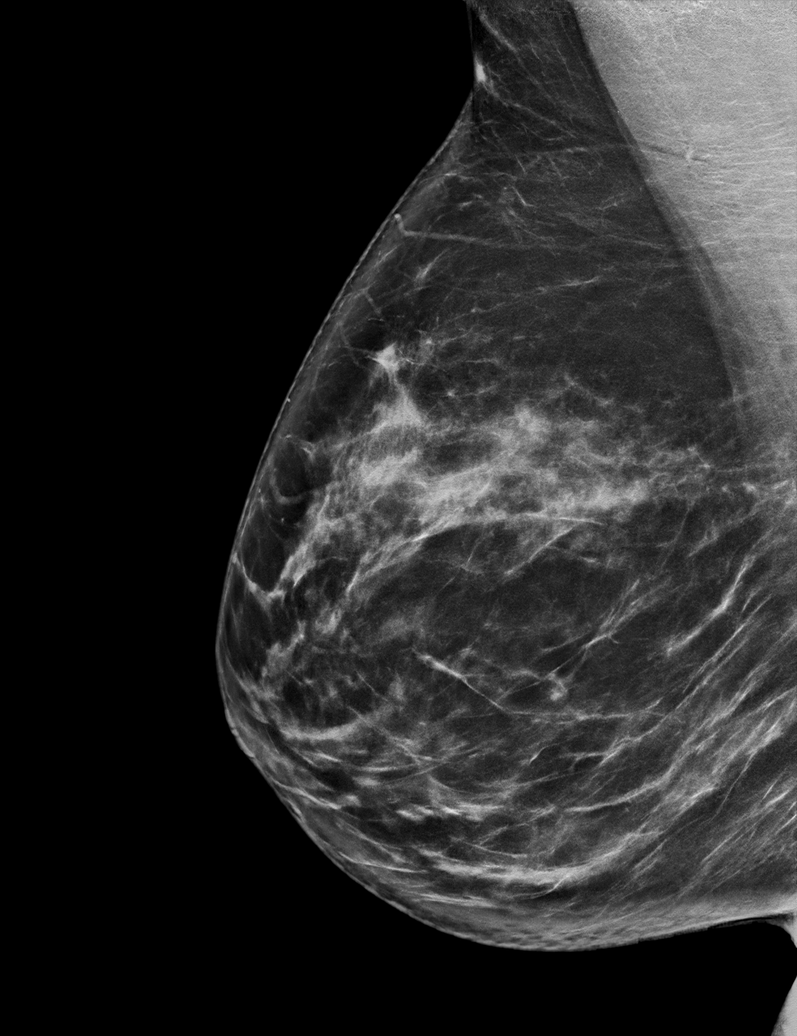

[R CC synth-2D]
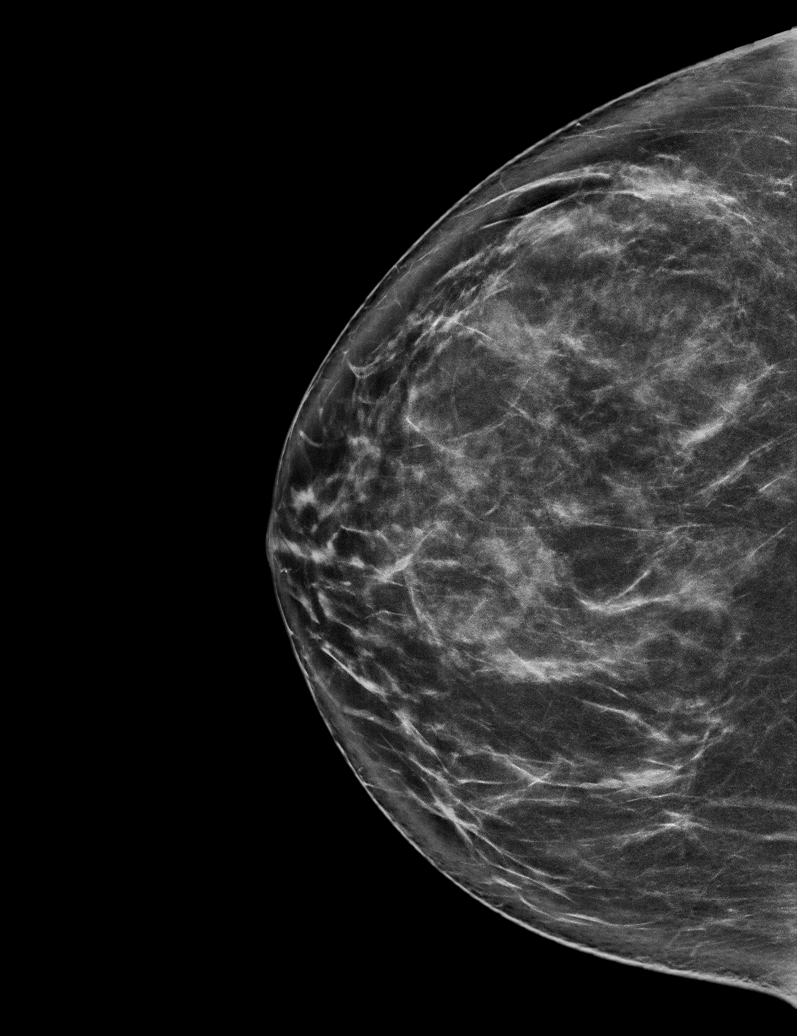

[R CC]
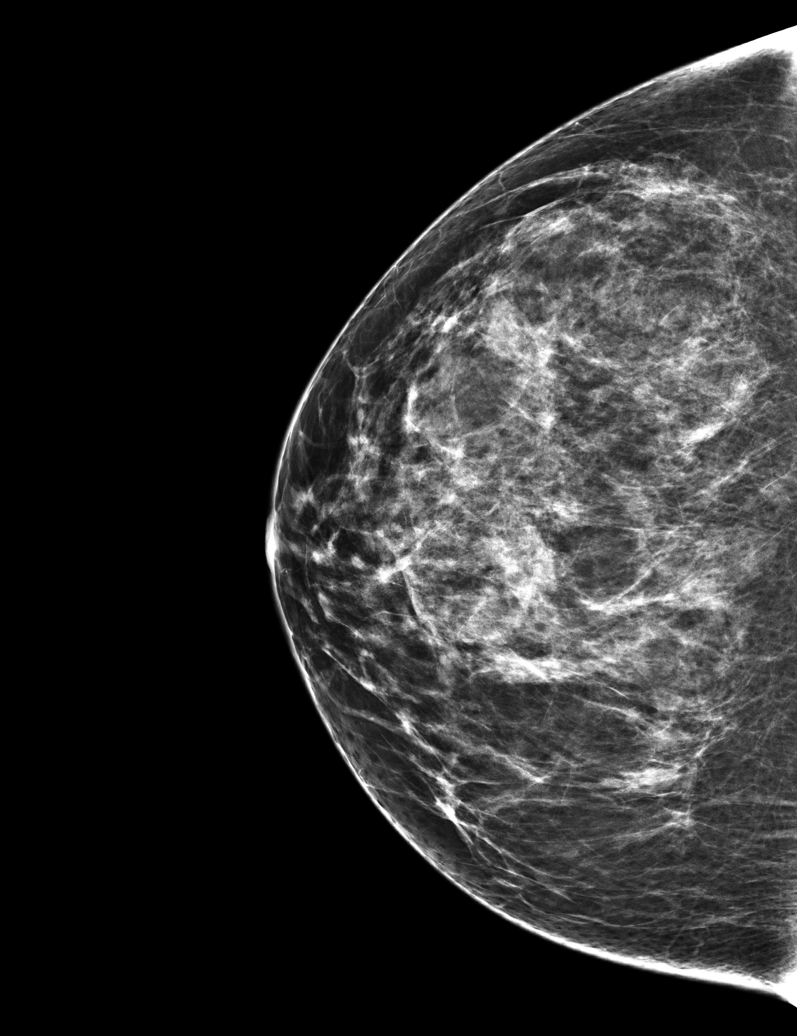

[R MLO tomo · tomo slice 43/85.0]
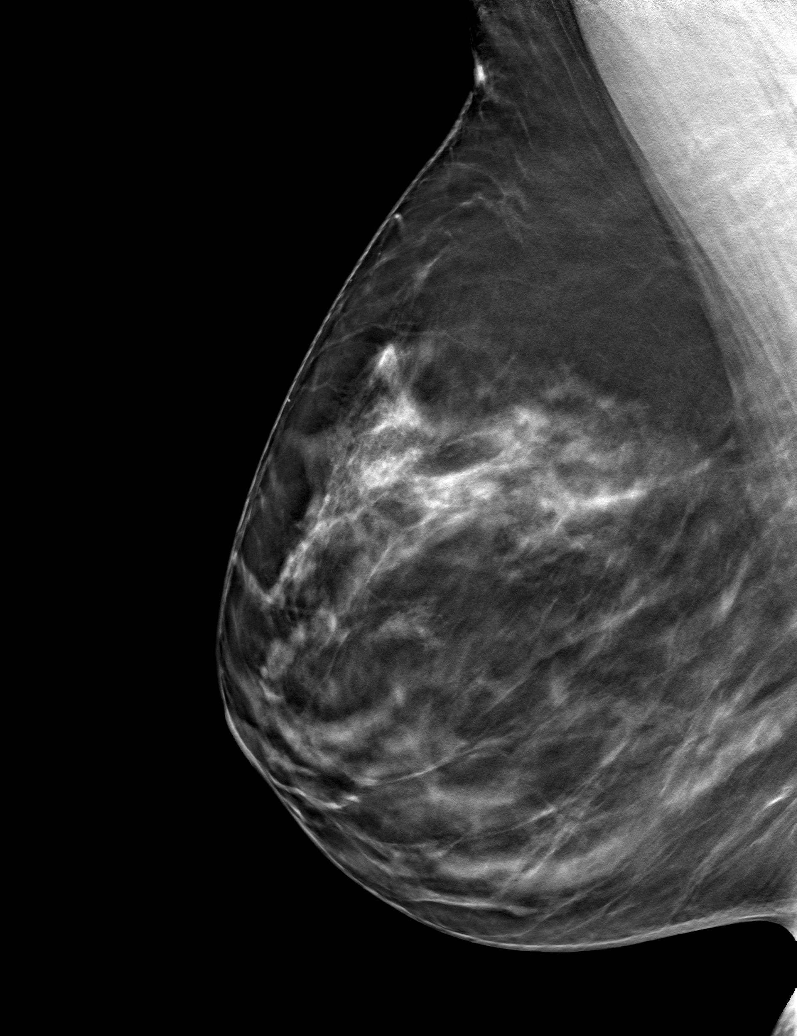

[R CC tomo · tomo slice 40/79.0]
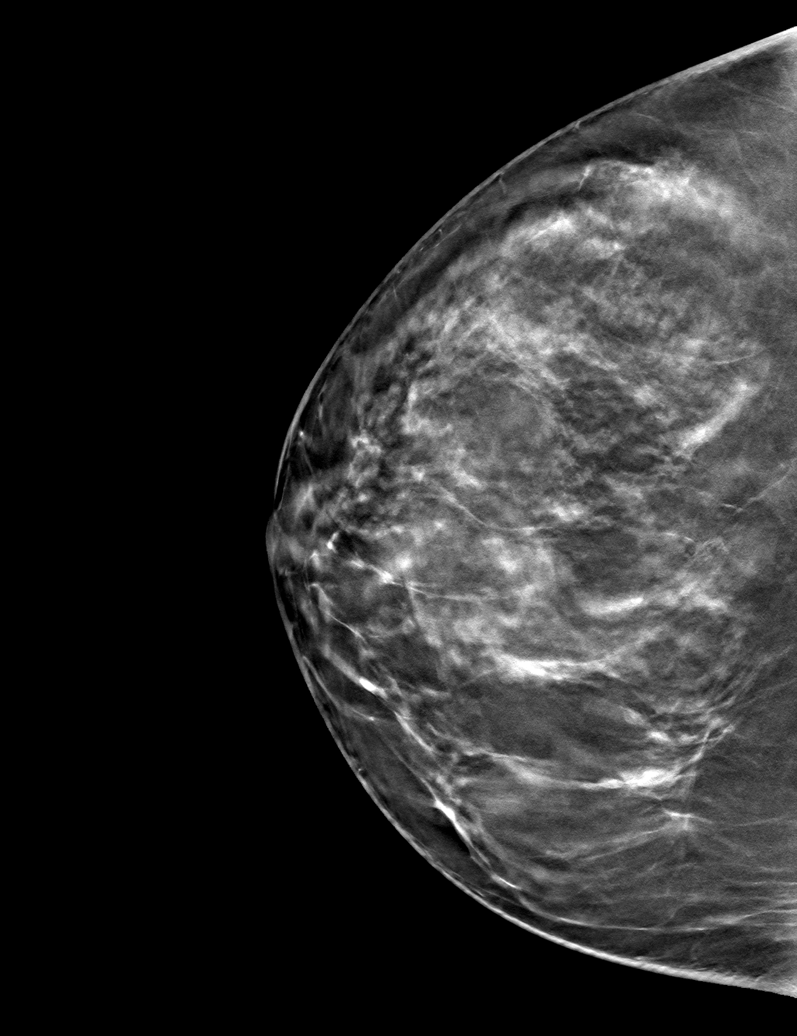

[6 of 14 positions shown; findings below may reference images not displayed]

ACR Breast Density Category c: The breast tissue is heterogeneously
dense, which may obscure small masses.
FINDINGS: Additional mammographic views of the right breast, including 3D
mammography demonstrates no suspicious masses, areas of
architectural distortion or microcalcifications. The previously seen
possible mass effaces to glandular tissue.

Mammographic images were processed with CAD.
IMPRESSION: No mammographic evidence of malignancy in the right breast.

RECOMMENDATION:
Screening mammogram in one year.(Code:[id number]-KTU)

I have discussed the findings and recommendations with the patient.
Results were also provided in writing at the conclusion of the
visit. If applicable, a reminder letter will be sent to the patient
regarding the next appointment.

BI-RADS CATEGORY  1: Negative.

## 2018-03-02 ENCOUNTER — Telehealth: Payer: Self-pay

## 2018-03-02 NOTE — Telephone Encounter (Signed)
Pt called and just had cleaning at dentist office. pts BP was 152/108 and 2nd ck was 147/104. Pt did not take xanax prior to going to dental appt. Pt took lisinopril 20 mg last night.pt not having any CP. SOB,H/A,dizziness or vision changes. Pt is anxious and is always nervous about going to dentist. Pt was told at dentist office she could have a stroke. Pt has not slept well for 2 nights because 3 weeks ago pt stopped BC pill that she has been taking for 10 yrs.pt is having hot flashes. Pt said she talked to Pamala Hurry NP about stopping BC pill last year.pt is sitting in parking lot at dentist office because she is afraid to drive. Melanie CMA spoke with Pamala Hurry NP,  If pt can calm herself, I already advised pt to breath deep breaths through nose and exhale thru mouth, pt can drive home, take xanax and relax. With pts hx R Baity does not think pt is going to stroke. When I went back to phone pt had found a xanax in her purse and has already taken it. Pt will try to relax, she is drinking water and believes she is able to drive home; if not pt will call EMS. Pt said she is feeling some better by just talking about something besides having a stroke. Pt will cb if needed. FYI to Pamala Hurry NP.

## 2018-03-02 NOTE — Telephone Encounter (Signed)
Noted, discussed with CMA via telephone

## 2018-03-03 ENCOUNTER — Encounter: Payer: Self-pay | Admitting: Internal Medicine

## 2018-03-03 ENCOUNTER — Other Ambulatory Visit: Payer: Self-pay | Admitting: Internal Medicine

## 2018-03-03 MED ORDER — ALPRAZOLAM 0.25 MG PO TABS: 0.2500 mg | ORAL_TABLET | Freq: Every day | ORAL | 0 refills | Status: DC | PRN

## 2018-03-03 NOTE — Telephone Encounter (Signed)
Also wants refill on Xanax and was last filled 01/25/2018... Please advise

## 2018-03-08 ENCOUNTER — Encounter: Payer: Self-pay | Admitting: Internal Medicine

## 2018-03-21 ENCOUNTER — Other Ambulatory Visit: Payer: Self-pay | Admitting: Internal Medicine

## 2018-03-29 ENCOUNTER — Other Ambulatory Visit: Payer: Self-pay | Admitting: Internal Medicine

## 2018-04-10 ENCOUNTER — Other Ambulatory Visit: Payer: Self-pay | Admitting: Internal Medicine

## 2018-04-10 NOTE — Telephone Encounter (Signed)
Last filled 03/03/2018... Please advise

## 2018-05-09 ENCOUNTER — Other Ambulatory Visit: Payer: Self-pay | Admitting: Internal Medicine

## 2018-05-10 NOTE — Telephone Encounter (Signed)
Last filled 04/10/2018... Please advise

## 2018-06-05 ENCOUNTER — Other Ambulatory Visit: Payer: Self-pay | Admitting: Internal Medicine

## 2018-06-05 DIAGNOSIS — Z1231 Encounter for screening mammogram for malignant neoplasm of breast: Secondary | ICD-10-CM

## 2018-06-13 ENCOUNTER — Other Ambulatory Visit: Payer: Self-pay | Admitting: Internal Medicine

## 2018-06-21 ENCOUNTER — Other Ambulatory Visit: Payer: Self-pay | Admitting: Internal Medicine

## 2018-06-22 ENCOUNTER — Other Ambulatory Visit: Payer: Self-pay | Admitting: Internal Medicine

## 2018-06-22 MED ORDER — ALPRAZOLAM 0.25 MG PO TABS: 0.2500 mg | ORAL_TABLET | Freq: Two times a day (BID) | ORAL | 0 refills | Status: DC | PRN

## 2018-06-22 NOTE — Telephone Encounter (Signed)
Last filled 05-10-18 #30 Last OV 11-01-17 No Future OV [facility] [address]

## 2018-06-22 NOTE — Telephone Encounter (Signed)
Electronic refill request. Alprazolam Last office visit:   11/01/17 Acute Last Filled:    30 tablet 0 06/22/2018  Please advise.

## 2018-07-07 ENCOUNTER — Other Ambulatory Visit: Payer: Self-pay | Admitting: Internal Medicine

## 2018-07-11 ENCOUNTER — Other Ambulatory Visit: Payer: Self-pay | Admitting: Internal Medicine

## 2018-07-12 NOTE — Telephone Encounter (Signed)
Overdue CPE letter Verl Blalock

## 2018-07-13 ENCOUNTER — Ambulatory Visit
Admission: RE | Admit: 2018-07-13 | Discharge: 2018-07-13 | Disposition: A | Discharge status: Home / Self Care | Payer: 59 | Source: Ambulatory Visit | Attending: Internal Medicine | Admitting: Internal Medicine

## 2018-07-13 DIAGNOSIS — Z1231 Encounter for screening mammogram for malignant neoplasm of breast: Secondary | ICD-10-CM | POA: Insufficient documentation

## 2018-07-19 ENCOUNTER — Encounter: Payer: Self-pay | Admitting: Internal Medicine

## 2018-07-21 ENCOUNTER — Ambulatory Visit (INDEPENDENT_AMBULATORY_CARE_PROVIDER_SITE_OTHER): Payer: 59 | Admitting: Internal Medicine

## 2018-07-21 ENCOUNTER — Encounter: Payer: Self-pay | Admitting: Internal Medicine

## 2018-07-21 VITALS — BP 128/84 | HR 81 | Temp 98.1°F | Wt 216.0 lb

## 2018-07-21 DIAGNOSIS — N898 Other specified noninflammatory disorders of vagina: Secondary | ICD-10-CM

## 2018-07-21 NOTE — Patient Instructions (Signed)
Vaginitis    Vaginitis is irritation and swelling (inflammation) of the vagina. It happens when normal bacteria and yeast in the vagina grow too much. There are many types of this condition. Treatment will depend on the type you have.  Follow these instructions at home:  Lifestyle  · Keep your vagina area clean and dry.  ? Avoid using soap.  ? Rinse the area with water.  · Do not do the following until your doctor says it is okay:  ? Wash and clean out the vagina (douche).  ? Use tampons.  ? Have sex.  · Wipe from front to back after going to the bathroom.  · Let air reach your vagina.  ? Wear cotton underwear.  ? Do not wear:  ? Underwear while you sleep.  ? Tight pants.  ? Thong underwear.  ? Underwear or nylons without a cotton panel.  ? Take off any wet clothing, such as bathing suits, as soon as possible.  · Use gentle, non-scented products. Do not use things that can irritate the vagina, such as fabric softeners. Avoid the following products if they are scented:  ? Feminine sprays.  ? Detergents.  ? Tampons.  ? Feminine hygiene products.  ? Soaps or bubble baths.  · Practice safe sex and use condoms.  General instructions  · Take over-the-counter and prescription medicines only as told by your doctor.  · If you were prescribed an antibiotic medicine, take or use it as told by your doctor. Do not stop taking or using the antibiotic even if you start to feel better.  · Keep all follow-up visits as told by your doctor. This is important.  Contact a doctor if:  · You have pain in your belly.  · You have a fever.  · Your symptoms last for more than 2-3 days.  Get help right away if:  · You have a fever and your symptoms get worse all of a sudden.  Summary  · Vaginitis is irritation and swelling of the vagina. It can happen when the normal bacteria and yeast in the vagina grow too much. There are many types.  · Treatment will depend on the type you have.  · Do not douche, use tampons , or have sex until your health  care provider approves. When you can return to sex, practice safe sex and use condoms.  This information is not intended to replace advice given to you by your health care provider. Make sure you discuss any questions you have with your health care provider.  Document Released: 09/10/2008 Document Revised: 07/06/2016 Document Reviewed: 07/06/2016  Elsevier Interactive Patient Education © 2019 Elsevier Inc.

## 2018-07-21 NOTE — Addendum Note (Signed)
Addended by: Roena Malady on: 07/21/2018 04:56 PM   Modules accepted: Orders

## 2018-07-21 NOTE — Progress Notes (Signed)
Subjective:    Patient ID: Melissa Cowan, female    DOB: 11-Mar-1966, 53 y.o.   MRN: [id number]  HPI  Pt presents to the clinic today with c/o vaginal odor. She noticed this 1 month ago. She denies vaginal discharge, irritation, itching or abnormal bleeding. She denies urgency, frequency, dysuria or blood in her urine. She is sexually active with 1 partner. She has not tried anything OTC for her symptoms.  Review of Systems      Past Medical History:  Diagnosis Date  . Allergy   . Depression   . Endometriosis determined by laparoscopy 1993  . GERD (gastroesophageal reflux disease)   . Hypertension     Current Outpatient Medications  Medication Sig Dispense Refill  . ALPRAZolam (XANAX) 0.25 MG tablet Take 1 tablet (0.25 mg total) by mouth 2 (two) times daily as needed for anxiety. 30 tablet 0  . fexofenadine (ALLEGRA) 180 MG tablet TAKE 1 TABLET (180 MG TOTAL) BY MOUTH DAILY. 30 tablet 5  . FLUoxetine (PROZAC) 40 MG capsule TAKE 1 CAPSULE (40 MG TOTAL) BY MOUTH DAILY. MUST SCHEDULE ANNUAL PHYSICAL 30 capsule 0  . fluticasone (FLONASE) 50 MCG/ACT nasal spray Place 2 sprays into both nostrils daily. 16 g 0  . ibuprofen (ADVIL,MOTRIN) 200 MG tablet Take by mouth.    . JUNEL 1/20 1-20 MG-MCG tablet TAKE 1 TABLET BY MOUTH EVERY DAY 21 tablet 1  . lisinopril (PRINIVIL,ZESTRIL) 20 MG tablet Take 1 tablet (20 mg total) by mouth daily. MUST SCHEDULE ANNUAL EXAM 30 tablet 0  . Omega-3 Fatty Acids (FISH OIL) 1000 MG CAPS Take 2 capsules by mouth 2 (two) times daily.     No current facility-administered medications for this visit.     Allergies  Allergen Reactions  . Amoxicillin-Pot Clavulanate Rash  . Tetracycline Rash    Family History  Problem Relation Age of Onset  . Alcohol abuse Mother   . Drug abuse Mother   . Cancer Mother        uterine  . Alcohol abuse Father   . Diabetes Father   . Stroke Maternal Grandmother   . Kidney disease Maternal Grandmother   . Breast cancer  Neg Hx     Social History   Socioeconomic History  . Marital status: Divorced    Spouse name: Not on file  . Number of children: Not on file  . Years of education: Not on file  . Highest education level: Not on file  Occupational History  . Not on file  Social Needs  . Financial resource strain: Not on file  . Food insecurity:    Worry: Not on file    Inability: Not on file  . Transportation needs:    Medical: Not on file    Non-medical: Not on file  Tobacco Use  . Smoking status: Former Smoker    Types: Cigarettes  . Smokeless tobacco: Never Used  . Tobacco comment: quit x 15 years ago  Substance and Sexual Activity  . Alcohol use: Yes    Alcohol/week: 0.0 standard drinks    Comment: social  . Drug use: No  . Sexual activity: Not Currently  Lifestyle  . Physical activity:    Days per week: Not on file    Minutes per session: Not on file  . Stress: Not on file  Relationships  . Social connections:    Talks on phone: Not on file    Gets together: Not on file    Attends religious  service: Not on file    Active member of club or organization: Not on file    Attends meetings of clubs or organizations: Not on file    Relationship status: Not on file  . Intimate partner violence:    Fear of current or ex partner: Not on file    Emotionally abused: Not on file    Physically abused: Not on file    Forced sexual activity: Not on file  Other Topics Concern  . Not on file  Social History Narrative  . Not on file     Constitutional: Denies fever, malaise, fatigue, headache or abrupt weight changes.  Respiratory: Denies difficulty breathing, shortness of breath, cough or sputum production.   Cardiovascular: Denies chest pain, chest tightness, palpitations or swelling in the hands or feet.  Gastrointestinal: Denies abdominal pain, bloating, constipation, diarrhea or blood in the stool.  GU: Pt reports vaginal odor. Denies urgency, frequency, pain with urination, burning  sensation, blood in urine, or discharge.  No other specific complaints in a complete review of systems (except as listed in HPI above).  Objective:   Physical Exam   BP 128/84   Pulse 81   Temp 98.1 F (36.7 C) (Oral)   Wt 216 lb (98 kg)   LMP 06/30/2018   SpO2 98%   BMI 33.83 kg/m  Wt Readings from Last 3 Encounters:  07/21/18 216 lb (98 kg)  11/01/17 216 lb (98 kg)  08/24/17 212 lb 8 oz (96.4 kg)    General: Appears her stated age, obese, in NAD. Abdomen: Soft and nontender. Normal bowel sounds. No distention or masses noted.  Pelvic: Self swabbed. Neurological: Alert and oriented.   BMET    Component Value Date/Time   NA 137 06/02/2017 1232   K 4.4 06/02/2017 1232   CL 103 06/02/2017 1232   CO2 27 06/02/2017 1232   GLUCOSE 76 06/02/2017 1232   BUN 12 06/02/2017 1232   CREATININE 0.64 06/02/2017 1232   CALCIUM 8.9 06/02/2017 1232    Lipid Panel     Component Value Date/Time   CHOL 167 06/02/2017 1232   TRIG 161.0 (H) 06/02/2017 1232   HDL 59.60 06/02/2017 1232   CHOLHDL 3 06/02/2017 1232   VLDL 32.2 06/02/2017 1232   LDLCALC 75 06/02/2017 1232    CBC    Component Value Date/Time   WBC 7.4 06/02/2017 1232   RBC 4.13 06/02/2017 1232   HGB 11.8 (L) 06/02/2017 1232   HCT 36.7 06/02/2017 1232   PLT 284.0 06/02/2017 1232   MCV 88.8 06/02/2017 1232   MCHC 32.3 06/02/2017 1232   RDW 13.6 06/02/2017 1232    Hgb A1C Lab Results  Component Value Date   HGBA1C 5.1 04/14/2017           Assessment & Plan:   Vaginal Odor:  Will send off wet prep Will hold off on medication until results are back  Return precautions discussed Melissa Reaperegina Singleton H[place]ox, NP

## 2018-07-22 LAB — WET PREP BY MOLECULAR PROBE
Candida species: NOT DETECTED
MICRO NUMBER:: 101602
SPECIMEN QUALITY:: ADEQUATE
Trichomonas vaginosis: NOT DETECTED

## 2018-07-23 ENCOUNTER — Other Ambulatory Visit: Payer: Self-pay | Admitting: Internal Medicine

## 2018-07-23 MED ORDER — METRONIDAZOLE 0.75 % VA GEL: 1.0000 | Freq: Two times a day (BID) | VAGINAL | 0 refills | Status: DC

## 2018-08-01 ENCOUNTER — Other Ambulatory Visit: Payer: Self-pay | Admitting: Internal Medicine

## 2018-08-01 NOTE — Telephone Encounter (Signed)
Last filled 06/22/2018...Marland Kitchen please advise

## 2018-08-02 ENCOUNTER — Other Ambulatory Visit: Payer: Self-pay

## 2018-08-07 ENCOUNTER — Encounter: Payer: Self-pay | Admitting: Internal Medicine

## 2018-08-13 ENCOUNTER — Other Ambulatory Visit: Payer: Self-pay | Admitting: Internal Medicine

## 2018-08-28 ENCOUNTER — Other Ambulatory Visit: Payer: Self-pay | Admitting: Internal Medicine

## 2018-08-28 MED ORDER — LISINOPRIL 20 MG PO TABS: 20.0000 mg | ORAL_TABLET | Freq: Every day | ORAL | 0 refills | Status: DC

## 2018-08-28 NOTE — Addendum Note (Signed)
Addended by: Roena Malady on: 08/28/2018 02:14 PM   Modules accepted: Orders

## 2018-09-06 ENCOUNTER — Encounter: Payer: Self-pay | Admitting: Internal Medicine

## 2018-09-06 ENCOUNTER — Ambulatory Visit (INDEPENDENT_AMBULATORY_CARE_PROVIDER_SITE_OTHER): Payer: 59 | Admitting: Internal Medicine

## 2018-09-06 ENCOUNTER — Other Ambulatory Visit: Payer: Self-pay

## 2018-09-06 VITALS — BP 132/88 | HR 74 | Temp 98.0°F | Ht 67.0 in | Wt 219.0 lb

## 2018-09-06 DIAGNOSIS — M179 Osteoarthritis of knee, unspecified: Secondary | ICD-10-CM | POA: Insufficient documentation

## 2018-09-06 DIAGNOSIS — F419 Anxiety disorder, unspecified: Secondary | ICD-10-CM

## 2018-09-06 DIAGNOSIS — Z0000 Encounter for general adult medical examination without abnormal findings: Secondary | ICD-10-CM | POA: Diagnosis not present

## 2018-09-06 DIAGNOSIS — M170 Bilateral primary osteoarthritis of knee: Secondary | ICD-10-CM

## 2018-09-06 DIAGNOSIS — Z79899 Other long term (current) drug therapy: Secondary | ICD-10-CM | POA: Diagnosis not present

## 2018-09-06 DIAGNOSIS — B002 Herpesviral gingivostomatitis and pharyngotonsillitis: Secondary | ICD-10-CM | POA: Diagnosis not present

## 2018-09-06 DIAGNOSIS — I10 Essential (primary) hypertension: Secondary | ICD-10-CM

## 2018-09-06 DIAGNOSIS — F329 Major depressive disorder, single episode, unspecified: Secondary | ICD-10-CM

## 2018-09-06 DIAGNOSIS — K219 Gastro-esophageal reflux disease without esophagitis: Secondary | ICD-10-CM

## 2018-09-06 DIAGNOSIS — F32A Depression, unspecified: Secondary | ICD-10-CM

## 2018-09-06 DIAGNOSIS — M1710 Unilateral primary osteoarthritis, unspecified knee: Secondary | ICD-10-CM | POA: Insufficient documentation

## 2018-09-06 DIAGNOSIS — Z1211 Encounter for screening for malignant neoplasm of colon: Secondary | ICD-10-CM

## 2018-09-06 MED ORDER — FLUOXETINE HCL 20 MG PO TABS: 40.0000 mg | ORAL_TABLET | Freq: Every day | ORAL | 5 refills | Status: DC

## 2018-09-06 NOTE — Assessment & Plan Note (Signed)
Controlled on Lisinopril CBC and CMET today Reinforced DASH diet and exercise for weight loss 

## 2018-09-06 NOTE — Assessment & Plan Note (Signed)
Support offered today Continue Fluoxetine and Xanax CSA and UDS today

## 2018-09-06 NOTE — Assessment & Plan Note (Signed)
Encouraged weight loss Continue Meloxicam  CMET today

## 2018-09-06 NOTE — Patient Instructions (Addendum)
Health Maintenance, Female Adopting a healthy lifestyle and getting preventive care can go a long way to promote health and wellness. Talk with your health care provider about what schedule of regular examinations is right for you. This is a good chance for you to check in with your provider about disease prevention and staying healthy. In between checkups, there are plenty of things you can do on your own. Experts have done a lot of research about which lifestyle changes and preventive measures are most likely to keep you healthy. Ask your health care provider for more information. Weight and diet Eat a healthy diet  Be sure to include plenty of vegetables, fruits, low-fat dairy products, and lean protein.  Do not eat a lot of foods high in solid fats, added sugars, or salt.  Get regular exercise. This is one of the most important things you can do for your health. ? Most adults should exercise for at least 150 minutes each week. The exercise should increase your heart rate and make you sweat (moderate-intensity exercise). ? Most adults should also do strengthening exercises at least twice a week. This is in addition to the moderate-intensity exercise. Maintain a healthy weight  Body mass index (BMI) is a measurement that can be used to identify possible weight problems. It estimates body fat based on height and weight. Your health care provider can help determine your BMI and help you achieve or maintain a healthy weight.  For females 20 years of age and older: ? A BMI below 18.5 is considered underweight. ? A BMI of 18.5 to 24.9 is normal. ? A BMI of 25 to 29.9 is considered overweight. ? A BMI of 30 and above is considered obese. Watch levels of cholesterol and blood lipids  You should start having your blood tested for lipids and cholesterol at 51 years of age, then have this test every 5 years.  You may need to have your cholesterol levels checked more often if: ? Your lipid or  cholesterol levels are high. ? You are older than 50 years of age. ? You are at high risk for heart disease. Cancer screening Lung Cancer  Lung cancer screening is recommended for adults 55-80 years old who are at high risk for lung cancer because of a history of smoking.  A yearly low-dose CT scan of the lungs is recommended for people who: ? Currently smoke. ? Have quit within the past 15 years. ? Have at least a 30-pack-year history of smoking. A pack year is smoking an average of one pack of cigarettes a day for 1 year.  Yearly screening should continue until it has been 15 years since you quit.  Yearly screening should stop if you develop a health problem that would prevent you from having lung cancer treatment. Breast Cancer  Practice breast self-awareness. This means understanding how your breasts normally appear and feel.  It also means doing regular breast self-exams. Let your health care provider know about any changes, no matter how small.  If you are in your 20s or 30s, you should have a clinical breast exam (CBE) by a health care provider every 1-3 years as part of a regular health exam.  If you are 40 or older, have a CBE every year. Also consider having a breast X-ray (mammogram) every year.  If you have a family history of breast cancer, talk to your health care provider about genetic screening.  If you are at high risk for breast cancer, talk   to your health care provider about having an MRI and a mammogram every year.  Breast cancer gene (BRCA) assessment is recommended for women who have family members with BRCA-related cancers. BRCA-related cancers include: ? Breast. ? Ovarian. ? Tubal. ? Peritoneal cancers.  Results of the assessment will determine the need for genetic counseling and BRCA1 and BRCA2 testing. Cervical Cancer Your health care provider may recommend that you be screened regularly for cancer of the pelvic organs (ovaries, uterus, and vagina).  This screening involves a pelvic examination, including checking for microscopic changes to the surface of your cervix (Pap test). You may be encouraged to have this screening done every 3 years, beginning at age 21.  For women ages 30-65, health care providers may recommend pelvic exams and Pap testing every 3 years, or they may recommend the Pap and pelvic exam, combined with testing for human papilloma virus (HPV), every 5 years. Some types of HPV increase your risk of cervical cancer. Testing for HPV may also be done on women of any age with unclear Pap test results.  Other health care providers may not recommend any screening for nonpregnant women who are considered low risk for pelvic cancer and who do not have symptoms. Ask your health care provider if a screening pelvic exam is right for you.  If you have had past treatment for cervical cancer or a condition that could lead to cancer, you need Pap tests and screening for cancer for at least 20 years after your treatment. If Pap tests have been discontinued, your risk factors (such as having a new sexual partner) need to be reassessed to determine if screening should resume. Some women have medical problems that increase the chance of getting cervical cancer. In these cases, your health care provider may recommend more frequent screening and Pap tests. Colorectal Cancer  This type of cancer can be detected and often prevented.  Routine colorectal cancer screening usually begins at 50 years of age and continues through 51 years of age.  Your health care provider may recommend screening at an earlier age if you have risk factors for colon cancer.  Your health care provider may also recommend using home test kits to check for hidden blood in the stool.  A small camera at the end of a tube can be used to examine your colon directly (sigmoidoscopy or colonoscopy). This is done to check for the earliest forms of colorectal cancer.  Routine  screening usually begins at age 50.  Direct examination of the colon should be repeated every 5-10 years through 51 years of age. However, you may need to be screened more often if early forms of precancerous polyps or small growths are found. Skin Cancer  Check your skin from head to toe regularly.  Tell your health care provider about any new moles or changes in moles, especially if there is a change in a mole's shape or color.  Also tell your health care provider if you have a mole that is larger than the size of a pencil eraser.  Always use sunscreen. Apply sunscreen liberally and repeatedly throughout the day.  Protect yourself by wearing long sleeves, pants, a wide-brimmed hat, and sunglasses whenever you are outside. Heart disease, diabetes, and high blood pressure  High blood pressure causes heart disease and increases the risk of stroke. High blood pressure is more likely to develop in: ? People who have blood pressure in the high end of the normal range (130-139/85-89 mm Hg). ? People   who are overweight or obese. ? People who are African American.  If you are 84-22 years of age, have your blood pressure checked every 3-5 years. If you are 67 years of age or older, have your blood pressure checked every year. You should have your blood pressure measured twice-once when you are at a hospital or clinic, and once when you are not at a hospital or clinic. Record the average of the two measurements. To check your blood pressure when you are not at a hospital or clinic, you can use: ? An automated blood pressure machine at a pharmacy. ? A home blood pressure monitor.  If you are between 52 years and 3 years old, ask your health care provider if you should take aspirin to prevent strokes.  Have regular diabetes screenings. This involves taking a blood sample to check your fasting blood sugar level. ? If you are at a normal weight and have a low risk for diabetes, have this test once  every three years after 51 years of age. ? If you are overweight and have a high risk for diabetes, consider being tested at a younger age or more often. Preventing infection Hepatitis B  If you have a higher risk for hepatitis B, you should be screened for this virus. You are considered at high risk for hepatitis B if: ? You were born in a country where hepatitis B is common. Ask your health care provider which countries are considered high risk. ? Your parents were born in a high-risk country, and you have not been immunized against hepatitis B (hepatitis B vaccine). ? You have HIV or AIDS. ? You use needles to inject street drugs. ? You live with someone who has hepatitis B. ? You have had sex with someone who has hepatitis B. ? You get hemodialysis treatment. ? You take certain medicines for conditions, including cancer, organ transplantation, and autoimmune conditions. Hepatitis C  Blood testing is recommended for: ? Everyone born from [birth year] through 1963. ? Anyone with known risk factors for hepatitis C. Sexually transmitted infections (STIs)  You should be screened for sexually transmitted infections (STIs) including gonorrhea and chlamydia if: ? You are sexually active and are younger than 51 years of age. ? You are older than 51 years of age and your health care provider tells you that you are at risk for this type of infection. ? Your sexual activity has changed since you were last screened and you are at an increased risk for chlamydia or gonorrhea. Ask your health care provider if you are at risk.  If you do not have HIV, but are at risk, it may be recommended that you take a prescription medicine daily to prevent HIV infection. This is called pre-exposure prophylaxis (PrEP). You are considered at risk if: ? You are sexually active and do not regularly use condoms or know the HIV status of your partner(s). ? You take drugs by injection. ? You are sexually active with a partner  who has HIV. Talk with your health care provider about whether you are at high risk of being infected with HIV. If you choose to begin PrEP, you should first be tested for HIV. You should then be tested every 3 months for as long as you are taking PrEP. Pregnancy  If you are premenopausal and you may become pregnant, ask your health care provider about preconception counseling.  If you may become pregnant, take 400 to 800 micrograms (mcg) of folic acid every  day.  If you want to prevent pregnancy, talk to your health care provider about birth control (contraception). Osteoporosis and menopause  Osteoporosis is a disease in which the bones lose minerals and strength with aging. This can result in serious bone fractures. Your risk for osteoporosis can be identified using a bone density scan.  If you are 38 years of age or older, or if you are at risk for osteoporosis and fractures, ask your health care provider if you should be screened.  Ask your health care provider whether you should take a calcium or vitamin D supplement to lower your risk for osteoporosis.  Menopause may have certain physical symptoms and risks.  Hormone replacement therapy may reduce some of these symptoms and risks. Talk to your health care provider about whether hormone replacement therapy is right for you. Follow these instructions at home:  Schedule regular health, dental, and eye exams.  Stay current with your immunizations.  Do not use any tobacco products including cigarettes, chewing tobacco, or electronic cigarettes.  If you are pregnant, do not drink alcohol.  If you are breastfeeding, limit how much and how often you drink alcohol.  Limit alcohol intake to no more than 1 drink per day for nonpregnant women. One drink equals 12 ounces of beer, 5 ounces of wine, or 1 ounces of hard liquor.  Do not use street drugs.  Do not share needles.  Ask your health care provider for help if you need support  or information about quitting drugs.  Tell your health care provider if you often feel depressed.  Tell your health care provider if you have ever been abused or do not feel safe at home. This information is not intended to replace advice given to you by your health care provider. Make sure you discuss any questions you have with your health care provider. Document Released: 12/28/2010 Document Revised: 11/20/2015 Document Reviewed: 03/18/2015 Elsevier Interactive Patient Education  2019 [place] Maintenance, Female Adopting a healthy lifestyle and getting preventive care can go a long way to promote health and wellness. Talk with your health care provider about what schedule of regular examinations is right for you. This is a good chance for you to check in with your provider about disease prevention and staying healthy. In between checkups, there are plenty of things you can do on your own. Experts have done a lot of research about which lifestyle changes and preventive measures are most likely to keep you healthy. Ask your health care provider for more information. Weight and diet Eat a healthy diet  Be sure to include plenty of vegetables, fruits, low-fat dairy products, and lean protein.  Do not eat a lot of foods high in solid fats, added sugars, or salt.  Get regular exercise. This is one of the most important things you can do for your health. ? Most adults should exercise for at least 150 minutes each week. The exercise should increase your heart rate and make you sweat (moderate-intensity exercise). ? Most adults should also do strengthening exercises at least twice a week. This is in addition to the moderate-intensity exercise. Maintain a healthy weight  Body mass index (BMI) is a measurement that can be used to identify possible weight problems. It estimates body fat based on height and weight. Your health care provider can help determine your BMI and help you achieve  or maintain a healthy weight.  For females 69 years of age and older: ? A BMI below 18.5  is considered underweight. ? A BMI of 18.5 to 24.9 is normal. ? A BMI of 25 to 29.9 is considered overweight. ? A BMI of 30 and above is considered obese. Watch levels of cholesterol and blood lipids  You should start having your blood tested for lipids and cholesterol at 51 years of age, then have this test every 5 years.  You may need to have your cholesterol levels checked more often if: ? Your lipid or cholesterol levels are high. ? You are older than 51 years of age. ? You are at high risk for heart disease. Cancer screening Lung Cancer  Lung cancer screening is recommended for adults 66-13 years old who are at high risk for lung cancer because of a history of smoking.  A yearly low-dose CT scan of the lungs is recommended for people who: ? Currently smoke. ? Have quit within the past 15 years. ? Have at least a 30-pack-year history of smoking. A pack year is smoking an average of one pack of cigarettes a day for 1 year.  Yearly screening should continue until it has been 15 years since you quit.  Yearly screening should stop if you develop a health problem that would prevent you from having lung cancer treatment. Breast Cancer  Practice breast self-awareness. This means understanding how your breasts normally appear and feel.  It also means doing regular breast self-exams. Let your health care provider know about any changes, no matter how small.  If you are in your 20s or 30s, you should have a clinical breast exam (CBE) by a health care provider every 1-3 years as part of a regular health exam.  If you are 58 or older, have a CBE every year. Also consider having a breast X-ray (mammogram) every year.  If you have a family history of breast cancer, talk to your health care provider about genetic screening.  If you are at high risk for breast cancer, talk to your health care provider  about having an MRI and a mammogram every year.  Breast cancer gene (BRCA) assessment is recommended for women who have family members with BRCA-related cancers. BRCA-related cancers include: ? Breast. ? Ovarian. ? Tubal. ? Peritoneal cancers.  Results of the assessment will determine the need for genetic counseling and BRCA1 and BRCA2 testing. Cervical Cancer Your health care provider may recommend that you be screened regularly for cancer of the pelvic organs (ovaries, uterus, and vagina). This screening involves a pelvic examination, including checking for microscopic changes to the surface of your cervix (Pap test). You may be encouraged to have this screening done every 3 years, beginning at age 54.  For women ages 9-65, health care providers may recommend pelvic exams and Pap testing every 3 years, or they may recommend the Pap and pelvic exam, combined with testing for human papilloma virus (HPV), every 5 years. Some types of HPV increase your risk of cervical cancer. Testing for HPV may also be done on women of any age with unclear Pap test results.  Other health care providers may not recommend any screening for nonpregnant women who are considered low risk for pelvic cancer and who do not have symptoms. Ask your health care provider if a screening pelvic exam is right for you.  If you have had past treatment for cervical cancer or a condition that could lead to cancer, you need Pap tests and screening for cancer for at least 20 years after your treatment. If Pap tests have been  discontinued, your risk factors (such as having a new sexual partner) need to be reassessed to determine if screening should resume. Some women have medical problems that increase the chance of getting cervical cancer. In these cases, your health care provider may recommend more frequent screening and Pap tests. Colorectal Cancer  This type of cancer can be detected and often prevented.  Routine colorectal  cancer screening usually begins at 50 years of age and continues through 51 years of age.  Your health care provider may recommend screening at an earlier age if you have risk factors for colon cancer.  Your health care provider may also recommend using home test kits to check for hidden blood in the stool.  A small camera at the end of a tube can be used to examine your colon directly (sigmoidoscopy or colonoscopy). This is done to check for the earliest forms of colorectal cancer.  Routine screening usually begins at age 50.  Direct examination of the colon should be repeated every 5-10 years through 51 years of age. However, you may need to be screened more often if early forms of precancerous polyps or small growths are found. Skin Cancer  Check your skin from head to toe regularly.  Tell your health care provider about any new moles or changes in moles, especially if there is a change in a mole's shape or color.  Also tell your health care provider if you have a mole that is larger than the size of a pencil eraser.  Always use sunscreen. Apply sunscreen liberally and repeatedly throughout the day.  Protect yourself by wearing long sleeves, pants, a wide-brimmed hat, and sunglasses whenever you are outside. Heart disease, diabetes, and high blood pressure  High blood pressure causes heart disease and increases the risk of stroke. High blood pressure is more likely to develop in: ? People who have blood pressure in the high end of the normal range (130-139/85-89 mm Hg). ? People who are overweight or obese. ? People who are African American.  If you are 18-39 years of age, have your blood pressure checked every 3-5 years. If you are 40 years of age or older, have your blood pressure checked every year. You should have your blood pressure measured twice-once when you are at a hospital or clinic, and once when you are not at a hospital or clinic. Record the average of the two  measurements. To check your blood pressure when you are not at a hospital or clinic, you can use: ? An automated blood pressure machine at a pharmacy. ? A home blood pressure monitor.  If you are between 55 years and 79 years old, ask your health care provider if you should take aspirin to prevent strokes.  Have regular diabetes screenings. This involves taking a blood sample to check your fasting blood sugar level. ? If you are at a normal weight and have a low risk for diabetes, have this test once every three years after 51 years of age. ? If you are overweight and have a high risk for diabetes, consider being tested at a younger age or more often. Preventing infection Hepatitis B  If you have a higher risk for hepatitis B, you should be screened for this virus. You are considered at high risk for hepatitis B if: ? You were born in a country where hepatitis B is common. Ask your health care provider which countries are considered high risk. ? Your parents were born in a high-risk country,   and you have not been immunized against hepatitis B (hepatitis B vaccine). ? You have HIV or AIDS. ? You use needles to inject street drugs. ? You live with someone who has hepatitis B. ? You have had sex with someone who has hepatitis B. ? You get hemodialysis treatment. ? You take certain medicines for conditions, including cancer, organ transplantation, and autoimmune conditions. Hepatitis C  Blood testing is recommended for: ? Everyone born from 1943 through 1965. ? Anyone with known risk factors for hepatitis C. Sexually transmitted infections (STIs)  You should be screened for sexually transmitted infections (STIs) including gonorrhea and chlamydia if: ? You are sexually active and are younger than 51 years of age. ? You are older than 51 years of age and your health care provider tells you that you are at risk for this type of infection. ? Your sexual activity has changed since you were last  screened and you are at an increased risk for chlamydia or gonorrhea. Ask your health care provider if you are at risk.  If you do not have HIV, but are at risk, it may be recommended that you take a prescription medicine daily to prevent HIV infection. This is called pre-exposure prophylaxis (PrEP). You are considered at risk if: ? You are sexually active and do not regularly use condoms or know the HIV status of your partner(s). ? You take drugs by injection. ? You are sexually active with a partner who has HIV. Talk with your health care provider about whether you are at high risk of being infected with HIV. If you choose to begin PrEP, you should first be tested for HIV. You should then be tested every 3 months for as long as you are taking PrEP. Pregnancy  If you are premenopausal and you may become pregnant, ask your health care provider about preconception counseling.  If you may become pregnant, take 400 to 800 micrograms (mcg) of folic acid every day.  If you want to prevent pregnancy, talk to your health care provider about birth control (contraception). Osteoporosis and menopause  Osteoporosis is a disease in which the bones lose minerals and strength with aging. This can result in serious bone fractures. Your risk for osteoporosis can be identified using a bone density scan.  If you are 65 years of age or older, or if you are at risk for osteoporosis and fractures, ask your health care provider if you should be screened.  Ask your health care provider whether you should take a calcium or vitamin D supplement to lower your risk for osteoporosis.  Menopause may have certain physical symptoms and risks.  Hormone replacement therapy may reduce some of these symptoms and risks. Talk to your health care provider about whether hormone replacement therapy is right for you. Follow these instructions at home:  Schedule regular health, dental, and eye exams.  Stay current with your  immunizations.  Do not use any tobacco products including cigarettes, chewing tobacco, or electronic cigarettes.  If you are pregnant, do not drink alcohol.  If you are breastfeeding, limit how much and how often you drink alcohol.  Limit alcohol intake to no more than 1 drink per day for nonpregnant women. One drink equals 12 ounces of beer, 5 ounces of wine, or 1 ounces of hard liquor.  Do not use street drugs.  Do not share needles.  Ask your health care provider for help if you need support or information about quitting drugs.  Tell your health   care provider if you often feel depressed.  Tell your health care provider if you have ever been abused or do not feel safe at home. This information is not intended to replace advice given to you by your health care provider. Make sure you discuss any questions you have with your health care provider. Document Released: 12/28/2010 Document Revised: 11/20/2015 Document Reviewed: 03/18/2015 Elsevier Interactive Patient Education  2019 [employer].

## 2018-09-06 NOTE — Assessment & Plan Note (Signed)
Currently not an issue off meds Will monitor 

## 2018-09-06 NOTE — Assessment & Plan Note (Signed)
No intervention needed at this time Will monitor 

## 2018-09-06 NOTE — Progress Notes (Signed)
Subjective:    Patient ID: Melissa Cowan, female    DOB: 01/11/1966, 53 y.o.   MRN: [id number]  HPI  Pt presents to the clinic today for her annual exam. She is also due to follow up chronic conditions.  HTN: Her BP today is 132/88. She is taking Lisinopril as prescribed. ECG from 12/2014 reviewed.  Cold Sores: Intermittent. She is not taking anything for suppression at this time.  Anxiety and Depression: Chronic. Stable on Fluoxetine and Xanax. She is due for CSA and UDS. She denies SI/HI.  GERD: Intermittent. Triggered by. She takes as needed with good relief. She has never had an upper GI.  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 She takes Meloxicam as prescribed. She follows with Dr. Martha Clan.  Flu: 03/2018 Tetanus: 11/2013 Pap Smear: 04/2016 Mammogram: 06/2018 Colon Screening: never Vision Screening: as needed Dentist: biannually  Diet: She does eat meat. She consumes fruits and veggies daily. She does eat some fried foods. She drinks mostly tea, water, coffee. Exericse: walking  Review of Systems      Past Medical History:  Diagnosis Date   Allergy    Depression    Endometriosis determined by laparoscopy 1993   GERD (gastroesophageal reflux disease)    Hypertension     Current Outpatient Medications  Medication Sig Dispense Refill   ALPRAZolam (XANAX) 0.5 MG tablet TAKE 1 TABLET BY MOUTH DAILY AS NEEDED 30 tablet 0   fexofenadine (ALLEGRA) 180 MG tablet TAKE 1 TABLET (180 MG TOTAL) BY MOUTH DAILY. 30 tablet 5   FLUoxetine (PROZAC) 40 MG capsule TAKE 1 CAPSULE (40 MG TOTAL) BY MOUTH DAILY. MUST SCHEDULE ANNUAL PHYSICAL 30 capsule 0   fluticasone (FLONASE) 50 MCG/ACT nasal spray Place 2 sprays into both nostrils daily. 16 g 0   ibuprofen (ADVIL,MOTRIN) 200 MG tablet Take by mouth.     lisinopril (PRINIVIL,ZESTRIL) 20 MG tablet Take 1 tablet (20 mg total) by mouth daily. MUST SCHEDULE ANNUAL PHYSICAL 90 tablet 0   meloxicam (MOBIC) 7.5 MG tablet Take 1 tablet by  mouth daily.     metroNIDAZOLE (METROGEL) 0.75 % vaginal gel Place 1 Applicatorful vaginally 2 (two) times daily. 70 g 0   Omega-3 Fatty Acids (FISH OIL) 1000 MG CAPS Take 2 capsules by mouth 2 (two) times daily.     No current facility-administered medications for this visit.     Allergies  Allergen Reactions   Amoxicillin-Pot Clavulanate Rash   Tetracycline Rash    Family History  Problem Relation Age of Onset   Alcohol abuse Mother    Drug abuse Mother    Cancer Mother        uterine   Alcohol abuse Father    Diabetes Father    Stroke Maternal Grandmother    Kidney disease Maternal Grandmother    Breast cancer Neg Hx     Social History   Socioeconomic History   Marital status: Divorced    Spouse name: Not on file   Number of children: Not on file   Years of education: Not on file   Highest education level: Not on file  Occupational History   Not on file  Social Needs   Financial resource strain: Not on file   Food insecurity:    Worry: Not on file    Inability: Not on file   Transportation needs:    Medical: Not on file    Non-medical: Not on file  Tobacco Use   Smoking status: Former Smoker  Types: Cigarettes   Smokeless tobacco: Never Used   Tobacco comment: quit x 15 years ago  Substance and Sexual Activity   Alcohol use: Yes    Alcohol/week: 0.0 standard drinks    Comment: social   Drug use: No   Sexual activity: Not Currently  Lifestyle   Physical activity:    Days per week: Not on file    Minutes per session: Not on file   Stress: Not on file  Relationships   Social connections:    Talks on phone: Not on file    Gets together: Not on file    Attends religious service: Not on file    Active member of club or organization: Not on file    Attends meetings of clubs or organizations: Not on file    Relationship status: Not on file   Intimate partner violence:    Fear of current or ex partner: Not on file     Emotionally abused: Not on file    Physically abused: Not on file    Forced sexual activity: Not on file  Other Topics Concern   Not on file  Social History Narrative   Not on file     Constitutional: Denies fever, malaise, fatigue, headache or abrupt weight changes.  HEENT: Denies eye pain, eye redness, ear pain, ringing in the ears, wax buildup, runny nose, nasal congestion, bloody nose, or sore throat. Respiratory: Denies difficulty breathing, shortness of breath, cough or sputum production.   Cardiovascular: Denies chest pain, chest tightness, palpitations or swelling in the hands or feet.  Gastrointestinal: Denies abdominal pain, bloating, constipation, diarrhea or blood in the stool.  GU: Denies urgency, frequency, pain with urination, burning sensation, blood in urine, odor or discharge. Musculoskeletal: Denies decrease in range of motion, difficulty with gait, muscle pain or joint pain and swelling.  Skin: Denies redness, rashes, lesions or ulcercations.  Neurological: Denies dizziness, difficulty with memory, difficulty with speech or problems with balance and coordination.  Psych: Pt has a history of anxiety and depression. Denies SI/HI.  No other specific complaints in a complete review of systems (except as listed in HPI above).  Objective:   Physical Exam    Pulse 74    Temp 98 F (36.7 C) (Oral)    Ht 5\' 7"  (1.702 m)    Wt 219 lb (99.3 kg)    SpO2 98%    BMI 34.30 kg/m  Wt Readings from Last 3 Encounters:  09/06/18 219 lb (99.3 kg)  07/21/18 216 lb (98 kg)  11/01/17 216 lb (98 kg)    General: Appears her stated age, obese, in NAD. Skin: Warm, dry and intact.  HEENT: Head: normal shape and size; Eyes: sclera white, no icterus, conjunctiva pink, PERRLA and EOMs intact; Ears: Tm's gray and intact, normal light reflex; Throat/Mouth: Teeth present, mucosa pink and moist, no exudate, lesions or ulcerations noted.  Neck:  Neck supple, trachea midline. No masses, lumps  or thyromegaly present.  Cardiovascular: Normal rate and rhythm. S1,S2 noted.  No murmur, rubs or gallops noted. No JVD or BLE edema. No carotid bruits noted. Pulmonary/Chest: Normal effort and positive vesicular breath sounds. No respiratory distress. No wheezes, rales or ronchi noted.  Abdomen: Soft and nontender. Normal bowel sounds. No distention or masses noted. Liver, spleen and kidneys non palpable. Musculoskeletal: Strength 5/5 BUE/BLE. No difficulty with gait.  Neurological: Alert and oriented. Cranial nerves II-XII grossly intact. Coordination normal.  Psychiatric: Mood and affect normal. Behavior is normal. Judgment  and thought content normal.     BMET    Component Value Date/Time   NA 137 06/02/2017 1232   K 4.4 06/02/2017 1232   CL 103 06/02/2017 1232   CO2 27 06/02/2017 1232   GLUCOSE 76 06/02/2017 1232   BUN 12 06/02/2017 1232   CREATININE 0.64 06/02/2017 1232   CALCIUM 8.9 06/02/2017 1232    Lipid Panel     Component Value Date/Time   CHOL 167 06/02/2017 1232   TRIG 161.0 (H) 06/02/2017 1232   HDL 59.60 06/02/2017 1232   CHOLHDL 3 06/02/2017 1232   VLDL 32.2 06/02/2017 1232   LDLCALC 75 06/02/2017 1232    CBC    Component Value Date/Time   WBC 7.4 06/02/2017 1232   RBC 4.13 06/02/2017 1232   HGB 11.8 (L) 06/02/2017 1232   HCT 36.7 06/02/2017 1232   PLT 284.0 06/02/2017 1232   MCV 88.8 06/02/2017 1232   MCHC 32.3 06/02/2017 1232   RDW 13.6 06/02/2017 1232    Hgb A1C Lab Results  Component Value Date   HGBA1C 5.1 04/14/2017          Assessment & Plan:   Preventative Health Maintenance:  Flu shot UTD Tetanus UTD Pap smear due next year Mammogram UTD Referral to GI for screening colonoscopy Encouraged her to consume a balanced diet and exercise regimen Advised her to see an eye doctor and dentist annually Will check CBC, CMET, Lipid, A1C and Vit D today  RTC in 1 year, sooner if needed Nicki Reaperegina Renley Gutman, NP

## 2018-09-07 LAB — COMPREHENSIVE METABOLIC PANEL
ALT: 18 U/L (ref 0–35)
AST: 15 U/L (ref 0–37)
Albumin: 4.3 g/dL (ref 3.5–5.2)
Alkaline Phosphatase: 86 U/L (ref 39–117)
BUN: 18 mg/dL (ref 6–23)
CO2: 24 mEq/L (ref 19–32)
Calcium: 9.6 mg/dL (ref 8.4–10.5)
Chloride: 103 mEq/L (ref 96–112)
Creatinine, Ser: 0.8 mg/dL (ref 0.40–1.20)
GFR: 75.82 mL/min (ref >60.00)
Glucose, Bld: 76 mg/dL (ref 70–99)
Potassium: 4.4 mEq/L (ref 3.5–5.1)
SODIUM: 137 meq/L (ref 135–145)
Total Bilirubin: 0.2 mg/dL (ref 0.2–1.2)
Total Protein: 7.1 g/dL (ref 6.0–8.3)

## 2018-09-07 LAB — LIPID PANEL
Cholesterol: 220 mg/dL — ABNORMAL HIGH (ref 0–200)
HDL: 65.6 mg/dL (ref >39.00)
NonHDL: 154.62
Total CHOL/HDL Ratio: 3
Triglycerides: 305 mg/dL — ABNORMAL HIGH (ref 0.0–149.0)
VLDL: 61 mg/dL — AB (ref 0.0–40.0)

## 2018-09-07 LAB — LDL CHOLESTEROL, DIRECT: Direct LDL: 131 mg/dL

## 2018-09-07 LAB — CBC
HCT: 37.4 % (ref 36.0–46.0)
Hemoglobin: 12.2 g/dL (ref 12.0–15.0)
MCHC: 32.7 g/dL (ref 30.0–36.0)
MCV: 88.5 fl (ref 78.0–100.0)
Platelets: 296 10*3/uL (ref 150.0–400.0)
RBC: 4.22 Mil/uL (ref 3.87–5.11)
RDW: 13.7 % (ref 11.5–15.5)
WBC: 6.9 10*3/uL (ref 4.0–10.5)

## 2018-09-07 LAB — HEMOGLOBIN A1C: Hgb A1c MFr Bld: 5.5 % (ref 4.6–6.5)

## 2018-09-07 LAB — VITAMIN D 25 HYDROXY (VIT D DEFICIENCY, FRACTURES): VITD: 28.87 ng/mL — ABNORMAL LOW (ref 30.00–100.00)

## 2018-09-09 LAB — PAIN MGMT, PROFILE 8 W/CONF, U
6 Acetylmorphine: NEGATIVE ng/mL (ref <10)
Alcohol Metabolites: POSITIVE ng/mL — AB (ref <500)
Amphetamines: NEGATIVE ng/mL (ref <500)
BUPRENORPHINE, URINE: NEGATIVE ng/mL (ref <5)
Benzodiazepines: NEGATIVE ng/mL (ref <100)
Cocaine Metabolite: NEGATIVE ng/mL (ref <150)
Creatinine: 23.1 mg/dL
Ethyl Glucuronide (ETG): 1941 ng/mL — ABNORMAL HIGH (ref <500)
Ethyl Sulfate (ETS): 1063 ng/mL — ABNORMAL HIGH (ref <100)
MDMA: NEGATIVE ng/mL (ref <500)
Marijuana Metabolite: NEGATIVE ng/mL (ref <20)
Opiates: NEGATIVE ng/mL (ref <100)
Oxidant: NEGATIVE ug/mL (ref <200)
Oxycodone: NEGATIVE ng/mL (ref <100)
pH: 6.95 (ref 4.5–9.0)

## 2018-09-11 ENCOUNTER — Other Ambulatory Visit: Payer: Self-pay | Admitting: Internal Medicine

## 2018-09-12 ENCOUNTER — Other Ambulatory Visit: Payer: Self-pay | Admitting: Internal Medicine

## 2018-09-12 MED ORDER — FLUOXETINE HCL 20 MG PO TABS: 40.0000 mg | ORAL_TABLET | Freq: Every day | ORAL | 2 refills | Status: DC

## 2018-09-12 NOTE — Telephone Encounter (Signed)
Last filled 08/02/2018... please advise 

## 2018-09-12 NOTE — Addendum Note (Signed)
Addended by: Roena Malady on: 09/12/2018 03:54 PM   Modules accepted: Orders

## 2018-09-15 ENCOUNTER — Encounter: Payer: Self-pay | Admitting: Internal Medicine

## 2018-10-25 ENCOUNTER — Other Ambulatory Visit: Payer: Self-pay | Admitting: Internal Medicine

## 2018-10-25 NOTE — Telephone Encounter (Signed)
Last filled 09/12/2018 please advise 

## 2018-11-01 ENCOUNTER — Encounter: Payer: Self-pay | Admitting: *Deleted

## 2018-11-02 ENCOUNTER — Telehealth: Payer: Self-pay | Admitting: Gastroenterology

## 2018-11-02 NOTE — Telephone Encounter (Signed)
Gastroenterology Pre-Procedure Review  Request Date: Melissa Cowan 02/01/2019  [facility] Requesting Physician: Dr. Allegra Lai  PATIENT REVIEW QUESTIONS: The patient responded to the following health history questions as indicated:    1. Are you having any GI issues? no 2. Do you have a personal history of Polyps? no 3. Do you have a family history of Colon Cancer or Polyps? no 4. Diabetes Mellitus? no 5. Joint replacements in the past 12 months?no 6. Major health problems in the past 3 months?no 7. Any artificial heart valves, MVP, or defibrillator?no    MEDICATIONS & ALLERGIES:    Patient reports the following regarding taking any anticoagulation/antiplatelet therapy:   Plavix, Coumadin, Eliquis, Xarelto, Lovenox, Pradaxa, Brilinta, or Effient? No  Aspirin? no  Patient confirms/reports the following medications:  Current Outpatient Medications  Medication Sig Dispense Refill  . ALPRAZolam (XANAX) 0.5 MG tablet TAKE 1 TABLET BY MOUTH EVERY DAY AS NEEDED 30 tablet 0  . fexofenadine (ALLEGRA) 180 MG tablet TAKE 1 TABLET (180 MG TOTAL) BY MOUTH DAILY. 30 tablet 5  . FLUoxetine (PROZAC) 20 MG tablet Take 2 tablets (40 mg total) by mouth daily. 180 tablet 2  . fluticasone (FLONASE) 50 MCG/ACT nasal spray Place 2 sprays into both nostrils daily. 16 g 0  . ibuprofen (ADVIL,MOTRIN) 200 MG tablet Take by mouth.    Marland Kitchen lisinopril (PRINIVIL,ZESTRIL) 20 MG tablet Take 1 tablet (20 mg total) by mouth daily. MUST SCHEDULE ANNUAL PHYSICAL 90 tablet 0  . meloxicam (MOBIC) 7.5 MG tablet Take 1 tablet by mouth daily.    . Omega-3 Fatty Acids (FISH OIL) 1000 MG CAPS Take 2 capsules by mouth 2 (two) times daily.     No current facility-administered medications for this visit.     Patient confirms/reports the following allergies:  Allergies  Allergen Reactions  . Amoxicillin-Pot Clavulanate Rash  . Tetracycline Rash    No orders of the defined types were placed in this encounter.   AUTHORIZATION  INFORMATION Primary Insurance: 1D#: Group #:  Secondary Insurance: 1D#: Group #:  SCHEDULE INFORMATION: Date:  Time: Location:

## 2018-11-02 NOTE — Telephone Encounter (Signed)
Pt is calling to schedule a colonoscopy she is aware you maybe calling her back next week from a private#

## 2018-11-03 ENCOUNTER — Encounter: Payer: Self-pay | Admitting: Internal Medicine

## 2018-11-03 MED ORDER — FLUOXETINE HCL 40 MG PO CAPS: 40.0000 mg | ORAL_CAPSULE | Freq: Every day | ORAL | 1 refills | Status: DC

## 2018-11-03 MED ORDER — FLUOXETINE HCL 20 MG PO CAPS: 40.0000 mg | ORAL_CAPSULE | Freq: Every day | ORAL | 1 refills | Status: DC

## 2018-11-06 ENCOUNTER — Other Ambulatory Visit: Payer: Self-pay

## 2018-11-06 DIAGNOSIS — Z1211 Encounter for screening for malignant neoplasm of colon: Secondary | ICD-10-CM

## 2018-11-26 ENCOUNTER — Other Ambulatory Visit: Payer: Self-pay | Admitting: Internal Medicine

## 2018-11-28 ENCOUNTER — Other Ambulatory Visit: Payer: Self-pay | Admitting: Internal Medicine

## 2018-11-28 NOTE — Telephone Encounter (Signed)
Last filled 10/25/2018.Marland KitchenMarland KitchenMarland Kitchen please advise

## 2018-12-26 ENCOUNTER — Other Ambulatory Visit: Payer: Self-pay | Admitting: Internal Medicine

## 2018-12-27 NOTE — Telephone Encounter (Signed)
Baity pt, last filled 11/28/2018 #30...UDS/CSA 09/06/18, please advise

## 2019-01-23 ENCOUNTER — Telehealth: Payer: Self-pay | Admitting: Gastroenterology

## 2019-01-23 ENCOUNTER — Encounter: Payer: Self-pay | Admitting: *Deleted

## 2019-01-23 NOTE — Telephone Encounter (Signed)
Patient called stating she is currently scheduled for a colonoscopy on 02-01-19 with Dr Marius Ditch & needs to hav a Covid test. Please call patient to let her know when.

## 2019-01-23 NOTE — Telephone Encounter (Signed)
Patient advised to have COVID test on Monday Aug 3rd.  Thanks [employer]

## 2019-01-29 ENCOUNTER — Other Ambulatory Visit
Admission: RE | Admit: 2019-01-29 | Discharge: 2019-01-29 | Disposition: A | Discharge status: Home / Self Care | Payer: 59 | Source: Ambulatory Visit | Attending: Gastroenterology | Admitting: Gastroenterology

## 2019-01-29 ENCOUNTER — Other Ambulatory Visit: Payer: Self-pay

## 2019-01-29 DIAGNOSIS — Z01812 Encounter for preprocedural laboratory examination: Secondary | ICD-10-CM | POA: Insufficient documentation

## 2019-01-29 DIAGNOSIS — Z20828 Contact with and (suspected) exposure to other viral communicable diseases: Secondary | ICD-10-CM | POA: Insufficient documentation

## 2019-01-29 LAB — SARS CORONAVIRUS 2 (TAT 6-24 HRS): SARS Coronavirus 2: NEGATIVE

## 2019-01-30 ENCOUNTER — Other Ambulatory Visit: Payer: Self-pay | Admitting: Internal Medicine

## 2019-01-31 NOTE — Telephone Encounter (Signed)
Last filled 12/27/2018... please advise

## 2019-02-01 ENCOUNTER — Other Ambulatory Visit: Payer: Self-pay

## 2019-02-01 ENCOUNTER — Ambulatory Visit
Admission: RE | Admit: 2019-02-01 | Discharge: 2019-02-01 | Disposition: A | Discharge status: Home / Self Care | Payer: 59 | Attending: Gastroenterology | Admitting: Gastroenterology

## 2019-02-01 ENCOUNTER — Encounter: Payer: Self-pay | Admitting: Anesthesiology

## 2019-02-01 ENCOUNTER — Ambulatory Visit: Payer: 59 | Admitting: Anesthesiology

## 2019-02-01 ENCOUNTER — Encounter
Admission: RE | Disposition: A | Discharge status: Home / Self Care | Payer: Self-pay | Source: Home / Self Care | Attending: Gastroenterology

## 2019-02-01 DIAGNOSIS — K219 Gastro-esophageal reflux disease without esophagitis: Secondary | ICD-10-CM | POA: Diagnosis not present

## 2019-02-01 DIAGNOSIS — Z1211 Encounter for screening for malignant neoplasm of colon: Secondary | ICD-10-CM

## 2019-02-01 DIAGNOSIS — Z881 Allergy status to other antibiotic agents status: Secondary | ICD-10-CM | POA: Insufficient documentation

## 2019-02-01 DIAGNOSIS — I10 Essential (primary) hypertension: Secondary | ICD-10-CM | POA: Diagnosis not present

## 2019-02-01 DIAGNOSIS — F329 Major depressive disorder, single episode, unspecified: Secondary | ICD-10-CM | POA: Insufficient documentation

## 2019-02-01 DIAGNOSIS — Z8489 Family history of other specified conditions: Secondary | ICD-10-CM | POA: Diagnosis not present

## 2019-02-01 DIAGNOSIS — Z833 Family history of diabetes mellitus: Secondary | ICD-10-CM | POA: Diagnosis not present

## 2019-02-01 DIAGNOSIS — Z811 Family history of alcohol abuse and dependence: Secondary | ICD-10-CM | POA: Insufficient documentation

## 2019-02-01 DIAGNOSIS — Z79899 Other long term (current) drug therapy: Secondary | ICD-10-CM | POA: Insufficient documentation

## 2019-02-01 DIAGNOSIS — Z880 Allergy status to penicillin: Secondary | ICD-10-CM | POA: Insufficient documentation

## 2019-02-01 DIAGNOSIS — Z87891 Personal history of nicotine dependence: Secondary | ICD-10-CM | POA: Insufficient documentation

## 2019-02-01 DIAGNOSIS — Z823 Family history of stroke: Secondary | ICD-10-CM | POA: Insufficient documentation

## 2019-02-01 DIAGNOSIS — Z841 Family history of disorders of kidney and ureter: Secondary | ICD-10-CM | POA: Diagnosis not present

## 2019-02-01 DIAGNOSIS — Z8049 Family history of malignant neoplasm of other genital organs: Secondary | ICD-10-CM | POA: Insufficient documentation

## 2019-02-01 HISTORY — PX: COLONOSCOPY WITH PROPOFOL: SHX5780

## 2019-02-01 SURGERY — COLONOSCOPY WITH PROPOFOL
Anesthesia: General

## 2019-02-01 MED ORDER — LIDOCAINE HCL (CARDIAC) PF 100 MG/5ML IV SOSY
PREFILLED_SYRINGE | INTRAVENOUS | Status: DC | PRN
  Administered 2019-02-01: 40 mg via INTRAVENOUS

## 2019-02-01 MED ORDER — MIDAZOLAM HCL 2 MG/2ML IJ SOLN
INTRAMUSCULAR | Status: AC
  Filled 2019-02-01: qty 2

## 2019-02-01 MED ORDER — PROPOFOL 10 MG/ML IV BOLUS
INTRAVENOUS | Status: DC | PRN
  Administered 2019-02-01: 100 mg via INTRAVENOUS

## 2019-02-01 MED ORDER — LIDOCAINE HCL (PF) 2 % IJ SOLN
INTRAMUSCULAR | Status: AC
  Filled 2019-02-01: qty 10

## 2019-02-01 MED ORDER — PROPOFOL 500 MG/50ML IV EMUL
INTRAVENOUS | Status: AC
  Filled 2019-02-01: qty 50

## 2019-02-01 MED ORDER — PROPOFOL 500 MG/50ML IV EMUL
INTRAVENOUS | Status: DC | PRN
  Administered 2019-02-01: 150 ug/kg/min via INTRAVENOUS

## 2019-02-01 MED ORDER — MIDAZOLAM HCL 2 MG/2ML IJ SOLN
INTRAMUSCULAR | Status: DC | PRN
  Administered 2019-02-01: 2 mg via INTRAVENOUS

## 2019-02-01 MED ORDER — SODIUM CHLORIDE 0.9 % IV SOLN
INTRAVENOUS | Status: DC
  Administered 2019-02-01: 09:00:00 via INTRAVENOUS

## 2019-02-01 NOTE — Transfer of Care (Signed)
Immediate Anesthesia Transfer of Care Note  Patient: Detta Hidrogo  Procedure(s) Performed: Procedure(s): COLONOSCOPY WITH PROPOFOL (N/A)  Patient Location: PACU and Endoscopy Unit  Anesthesia Type:General  Level of Consciousness: sedated  Airway & Oxygen Therapy: Patient Spontanous Breathing and Patient connected to nasal cannula oxygen  Post-op Assessment: Report given to RN and Post -op Vital signs reviewed and stable  Post vital signs: Reviewed and stable  Last Vitals:  Vitals:   02/01/19 0903 02/01/19 1026  BP: (!) 139/96 111/72  Pulse: 97   Resp: 16   Temp: 36.8 C (!) 36.3 C  SpO2: 71% 165%    Complications: No apparent anesthesia complications

## 2019-02-01 NOTE — H&P (Signed)
Melissa Darby, MD [address]  [place], [place] [address]  Main: [phone]  Fax: [phone] Pager: [phone]  Primary Care Physician:  Melissa Fenton, NP Primary Gastroenterologist:  Dr. Cephas Cowan  Pre-Procedure History & Physical: HPI:  Melissa Cowan is a 53 y.o. female is here for an colonoscopy.   Past Medical History:  Diagnosis Date  . Allergy   . Depression   . Endometriosis determined by laparoscopy 1993  . GERD (gastroesophageal reflux disease)   . Hypertension     Past Surgical History:  Procedure Laterality Date  . Strum    Prior to Admission medications   Medication Sig Start Date End Date Taking? Authorizing Provider  ALPRAZolam Duanne Moron) 0.5 MG tablet TAKE 1 TABLET BY MOUTH EVERY DAY AS NEEDED 12/27/18  Yes Baity, Coralie Keens, NP  FLUoxetine (PROZAC) 20 MG capsule Take 2 capsules (40 mg total) by mouth daily. 11/03/18  Yes Baity, Coralie Keens, NP  fluticasone (FLONASE) 50 MCG/ACT nasal spray Place 2 sprays into both nostrils daily. 04/14/17  Yes Baity, Coralie Keens, NP  ibuprofen (ADVIL,MOTRIN) 200 MG tablet Take by mouth.   Yes [provider]  lisinopril (ZESTRIL) 20 MG tablet TAKE 1 TABLET (20 MG TOTAL) BY MOUTH DAILY. MUST SCHEDULE ANNUAL PHYSICAL 11/28/18  Yes Baity, Coralie Keens, NP  fexofenadine (ALLEGRA) 180 MG tablet TAKE 1 TABLET (180 MG TOTAL) BY MOUTH DAILY. 03/21/17   Melissa Fenton, NP  meloxicam (MOBIC) 7.5 MG tablet Take 1 tablet by mouth daily. 06/13/18   [provider]  Omega-3 Fatty Acids (FISH OIL) 1000 MG CAPS Take 2 capsules by mouth 2 (two) times daily.    [provider]    Allergies as of 11/06/2018 - Review Complete 09/06/2018  Allergen Reaction Noted  . Amoxicillin-pot clavulanate Rash 06/10/2014  . Tetracycline Rash 06/10/2014    Family History  Problem Relation Age of Onset  . Alcohol abuse Mother   . Drug abuse Mother   . Cancer Mother        uterine  .  Alcohol abuse Father   . Diabetes Father   . Stroke Maternal Grandmother   . Kidney disease Maternal Grandmother   . Breast cancer Neg Hx     Social History   Socioeconomic History  . Marital status: Divorced    Spouse name: Not on file  . Number of children: Not on file  . Years of education: Not on file  . Highest education level: Not on file  Occupational History  . Not on file  Social Needs  . Financial resource strain: Not on file  . Food insecurity    Worry: Not on file    Inability: Not on file  . Transportation needs    Medical: Not on file    Non-medical: Not on file  Tobacco Use  . Smoking status: Former Smoker    Types: Cigarettes  . Smokeless tobacco: Never Used  . Tobacco comment: quit x 15 years ago  Substance and Sexual Activity  . Alcohol use: Yes    Alcohol/week: 0.0 standard drinks    Comment: social  . Drug use: No  . Sexual activity: Not Currently  Lifestyle  . Physical activity    Days per week: Not on file    Minutes per session: Not on file  . Stress: Not on file  Relationships  . Social connections    Talks on phone: Not on file  Gets together: Not on file    Attends religious service: Not on file    Active member of club or organization: Not on file    Attends meetings of clubs or organizations: Not on file    Relationship status: Not on file  . Intimate partner violence    Fear of current or ex partner: Not on file    Emotionally abused: Not on file    Physically abused: Not on file    Forced sexual activity: Not on file  Other Topics Concern  . Not on file  Social History Narrative  . Not on file    Review of Systems: See HPI, otherwise negative ROS  Physical Exam: BP (!) 139/96   Pulse 97   Temp 98.2 F (36.8 C) (Tympanic)   Resp 16   Ht 5\' 7"  (1.702 m)   Wt 95.3 kg   LMP 06/28/2018   SpO2 97%   BMI 32.89 kg/m  General:   Alert,  pleasant and cooperative in NAD Head:  Normocephalic and atraumatic. Neck:   Supple; no masses or thyromegaly. Lungs:  Clear throughout to auscultation.    Heart:  Regular rate and rhythm. Abdomen:  Soft, nontender and nondistended. Normal bowel sounds, without guarding, and without rebound.   Neurologic:  Alert and  oriented x4;  grossly normal neurologically.  Impression/Plan: Melissa Cowan is here for an colonoscopy to be performed for colon cancer screening  Risks, benefits, limitations, and alternatives regarding  colonoscopy have been reviewed with the patient.  Questions have been answered.  All parties agreeable.   Melissa Donathohini Vanga, MD  02/01/2019, 10:06 AM

## 2019-02-01 NOTE — Op Note (Signed)
[facility] Gastroenterology Patient Name: Melissa Cowan Procedure Date: 02/01/2019 10:01 AM MRN: [id number] Account #: [id number] Date of Birth: 12/27/1965 Admit Type: Outpatient Age: 53 Room: [facility] ENDO ROOM 2 Gender: Female Note Status: Finalized Procedure:            Colonoscopy Indications:          Screening for colorectal malignant neoplasm, This is                        the patient's first colonoscopy Providers:            Lin Landsman MD, MD Referring MD:         Jearld Fenton (Referring MD) Medicines:            Monitored Anesthesia Care Complications:        No immediate complications. Estimated blood loss: None. Procedure:            Pre-Anesthesia Assessment:                       - Prior to the procedure, a History and Physical was                        performed, and patient medications and allergies were                        reviewed. The patient is competent. The risks and                        benefits of the procedure and the sedation options and                        risks were discussed with the patient. All questions                        were answered and informed consent was obtained.                        Patient identification and proposed procedure were                        verified by the physician, the nurse, the                        anesthesiologist, the anesthetist and the technician in                        the pre-procedure area in the procedure room in the                        endoscopy suite. Mental Status Examination: alert and                        oriented. Airway Examination: normal oropharyngeal                        airway and neck mobility. Respiratory Examination:                        clear to auscultation. CV Examination: normal.  Prophylactic Antibiotics: The patient does not require                        prophylactic antibiotics. Prior Anticoagulants: The          patient has taken no previous anticoagulant or                        antiplatelet agents. ASA Grade Assessment: II - A                        patient with mild systemic disease. After reviewing the                        risks and benefits, the patient was deemed in                        satisfactory condition to undergo the procedure. The                        anesthesia plan was to use monitored anesthesia care                        (MAC). Immediately prior to administration of                        medications, the patient was re-assessed for adequacy                        to receive sedatives. The heart rate, respiratory rate,                        oxygen saturations, blood pressure, adequacy of                        pulmonary ventilation, and response to care were                        monitored throughout the procedure. The physical status                        of the patient was re-assessed after the procedure.                       After obtaining informed consent, the colonoscope was                        passed under direct vision. Throughout the procedure,                        the patient's blood pressure, pulse, and oxygen                        saturations were monitored continuously. The                        Colonoscope was introduced through the anus and                        advanced to the the cecum, identified by appendiceal  orifice and ileocecal valve. The colonoscopy was                        performed without difficulty. The patient tolerated the                        procedure well. The quality of the bowel preparation                        was evaluated using the BBPS [facility] Bowel Preparation                        Scale) with scores of: Right Colon = 3, Transverse                        Colon = 3 and Left Colon = 3 (entire mucosa seen well                        with no residual staining, small fragments of stool or                         opaque liquid). The total BBPS score equals 9. Findings:      The perianal and digital rectal examinations were normal. Pertinent       negatives include normal sphincter tone and no palpable rectal lesions.      The colon (entire examined portion) appeared normal.      The retroflexed view of the distal rectum and anal verge was normal and       showed no anal or rectal abnormalities. Impression:           - The entire examined colon is normal.                       - The distal rectum and anal verge are normal on                        retroflexion view.                       - No specimens collected. Recommendation:       - Discharge patient to home (with escort).                       - Resume regular diet today.                       - Continue present medications.                       - Repeat colonoscopy in 10 years for surveillance. Procedure Code(s):    --- Professional ---                       V4944, Colorectal cancer screening; colonoscopy on                        individual not meeting criteria for high risk Diagnosis Code(s):    --- Professional ---                       Z12.11, Encounter for  screening for malignant neoplasm                        of colon CPT copyright 2019 American [employer] Association. All rights reserved. The codes documented in this report are preliminary and upon coder review may  be revised to meet current compliance requirements. Dr. Ulyess Mort Lin Landsman MD, MD 02/01/2019 10:24:45 AM This report has been signed electronically. Number of Addenda: 0 Note Initiated On: 02/01/2019 10:01 AM Scope Withdrawal Time: 0 hours 7 minutes 14 seconds  Total Procedure Duration: 0 hours 11 minutes 2 seconds  Estimated Blood Loss: Estimated blood loss: none.      [facility]

## 2019-02-01 NOTE — Anesthesia Post-op Follow-up Note (Signed)
Anesthesia QCDR form completed.        

## 2019-02-01 NOTE — Anesthesia Preprocedure Evaluation (Signed)
Anesthesia Evaluation  Patient identified by MRN, date of birth, ID band Patient awake    Reviewed: Allergy & Precautions, NPO status , Patient's Chart, lab work & pertinent test results  Airway Mallampati: III       Dental   Pulmonary former smoker,    Pulmonary exam normal        Cardiovascular hypertension, Normal cardiovascular exam     Neuro/Psych PSYCHIATRIC DISORDERS Anxiety Depression negative neurological ROS     GI/Hepatic Neg liver ROS, GERD  ,  Endo/Other  negative endocrine ROS  Renal/GU negative Renal ROS  Female GU complaint     Musculoskeletal  (+) Arthritis ,   Abdominal Normal abdominal exam  (+)   Peds negative pediatric ROS (+)  Hematology negative hematology ROS (+)   Anesthesia Other Findings Past Medical History: No date: Allergy No date: Depression 1993: Endometriosis determined by laparoscopy No date: GERD (gastroesophageal reflux disease) No date: Hypertension  Reproductive/Obstetrics                             Anesthesia Physical Anesthesia Plan  ASA: II  Anesthesia Plan: General   Post-op Pain Management:    Induction: Intravenous  PONV Risk Score and Plan:   Airway Management Planned: Nasal Cannula  Additional Equipment:   Intra-op Plan:   Post-operative Plan:   Informed Consent: I have reviewed the patients History and Physical, chart, labs and discussed the procedure including the risks, benefits and alternatives for the proposed anesthesia with the patient or authorized representative who has indicated his/her understanding and acceptance.     Dental advisory given  Plan Discussed with: CRNA and Surgeon  Anesthesia Plan Comments:         Anesthesia Quick Evaluation

## 2019-02-01 NOTE — Anesthesia Procedure Notes (Signed)
Date/Time: 02/01/2019 10:07 AM Performed by: Doreen Salvage, CRNA Pre-anesthesia Checklist: Patient identified, Emergency Drugs available, Suction available and Patient being monitored Patient Re-evaluated:Patient Re-evaluated prior to induction Oxygen Delivery Method: Nasal cannula Induction Type: IV induction Dental Injury: Teeth and Oropharynx as per pre-operative assessment  Comments: Nasal cannula with etCO2 monitoring

## 2019-02-02 ENCOUNTER — Encounter: Payer: Self-pay | Admitting: Gastroenterology

## 2019-02-02 NOTE — Anesthesia Postprocedure Evaluation (Signed)
Anesthesia Post Note  Patient: Melissa Cowan  Procedure(s) Performed: COLONOSCOPY WITH PROPOFOL (N/A )  Patient location during evaluation: Endoscopy Anesthesia Type: General Level of consciousness: awake and alert and oriented Pain management: pain level controlled Vital Signs Assessment: post-procedure vital signs reviewed and stable Respiratory status: spontaneous breathing Cardiovascular status: blood pressure returned to baseline Anesthetic complications: no     Last Vitals:  Vitals:   02/01/19 1046 02/01/19 1056  BP: 130/86 (!) 150/96  Pulse:    Resp:    Temp:    SpO2:      Last Pain:  Vitals:   02/01/19 1056  TempSrc:   PainSc: 0-No pain                 Jeanne Diefendorf

## 2019-02-28 ENCOUNTER — Encounter: Payer: Self-pay | Admitting: Internal Medicine

## 2019-03-06 ENCOUNTER — Other Ambulatory Visit: Payer: Self-pay | Admitting: Internal Medicine

## 2019-03-06 NOTE — Telephone Encounter (Signed)
Last filled 02/01/2019.Marland KitchenMarland KitchenMarland Kitchen please advise

## 2019-03-21 ENCOUNTER — Ambulatory Visit (INDEPENDENT_AMBULATORY_CARE_PROVIDER_SITE_OTHER): Payer: 59 | Admitting: Psychology

## 2019-03-21 DIAGNOSIS — F330 Major depressive disorder, recurrent, mild: Secondary | ICD-10-CM | POA: Diagnosis not present

## 2019-03-23 ENCOUNTER — Other Ambulatory Visit: Payer: Self-pay | Admitting: Internal Medicine

## 2019-04-05 ENCOUNTER — Other Ambulatory Visit: Payer: Self-pay | Admitting: Internal Medicine

## 2019-04-05 NOTE — Telephone Encounter (Signed)
Last filled 03/06/2019... please advise  

## 2019-04-28 ENCOUNTER — Other Ambulatory Visit: Payer: Self-pay | Admitting: Internal Medicine

## 2019-05-07 ENCOUNTER — Ambulatory Visit (INDEPENDENT_AMBULATORY_CARE_PROVIDER_SITE_OTHER): Payer: 59 | Admitting: Psychology

## 2019-05-07 DIAGNOSIS — F331 Major depressive disorder, recurrent, moderate: Secondary | ICD-10-CM | POA: Diagnosis not present

## 2019-05-14 ENCOUNTER — Other Ambulatory Visit: Payer: Self-pay | Admitting: Internal Medicine

## 2019-05-15 NOTE — Telephone Encounter (Signed)
Last filled 04/06/2019.Marland KitchenMarland KitchenMarland Kitchen please advise

## 2019-06-06 ENCOUNTER — Ambulatory Visit (INDEPENDENT_AMBULATORY_CARE_PROVIDER_SITE_OTHER): Payer: 59 | Admitting: Psychology

## 2019-06-06 DIAGNOSIS — F331 Major depressive disorder, recurrent, moderate: Secondary | ICD-10-CM | POA: Diagnosis not present

## 2019-06-14 ENCOUNTER — Other Ambulatory Visit: Payer: Self-pay | Admitting: Internal Medicine

## 2019-06-14 NOTE — Telephone Encounter (Signed)
Last filled 05/16/2019... please advise  

## 2019-06-16 ENCOUNTER — Other Ambulatory Visit: Payer: Self-pay | Admitting: Internal Medicine

## 2019-06-26 ENCOUNTER — Other Ambulatory Visit: Payer: Self-pay | Admitting: Internal Medicine

## 2019-06-26 DIAGNOSIS — Z1231 Encounter for screening mammogram for malignant neoplasm of breast: Secondary | ICD-10-CM

## 2019-07-10 ENCOUNTER — Ambulatory Visit (INDEPENDENT_AMBULATORY_CARE_PROVIDER_SITE_OTHER): Payer: 59 | Admitting: Psychology

## 2019-07-10 DIAGNOSIS — F330 Major depressive disorder, recurrent, mild: Secondary | ICD-10-CM

## 2019-07-12 ENCOUNTER — Other Ambulatory Visit: Payer: Self-pay | Admitting: Internal Medicine

## 2019-07-13 NOTE — Telephone Encounter (Signed)
Last filled 06/15/2019, note [facility]F on or after 07/15/2019 as it is Sunday

## 2019-07-16 ENCOUNTER — Ambulatory Visit
Admission: RE | Admit: 2019-07-16 | Discharge: 2019-07-16 | Disposition: A | Discharge status: Home / Self Care | Payer: 59 | Source: Ambulatory Visit | Attending: Internal Medicine | Admitting: Internal Medicine

## 2019-07-16 DIAGNOSIS — Z1231 Encounter for screening mammogram for malignant neoplasm of breast: Secondary | ICD-10-CM | POA: Insufficient documentation

## 2019-07-17 ENCOUNTER — Other Ambulatory Visit: Payer: Self-pay | Admitting: Internal Medicine

## 2019-07-17 DIAGNOSIS — R928 Other abnormal and inconclusive findings on diagnostic imaging of breast: Secondary | ICD-10-CM

## 2019-07-17 DIAGNOSIS — N6320 Unspecified lump in the left breast, unspecified quadrant: Secondary | ICD-10-CM

## 2019-07-23 ENCOUNTER — Ambulatory Visit
Admission: RE | Admit: 2019-07-23 | Discharge: 2019-07-23 | Disposition: A | Discharge status: Home / Self Care | Payer: 59 | Source: Ambulatory Visit | Attending: Internal Medicine | Admitting: Internal Medicine

## 2019-07-23 DIAGNOSIS — N6320 Unspecified lump in the left breast, unspecified quadrant: Secondary | ICD-10-CM

## 2019-07-23 DIAGNOSIS — R928 Other abnormal and inconclusive findings on diagnostic imaging of breast: Secondary | ICD-10-CM

## 2019-07-26 ENCOUNTER — Other Ambulatory Visit: Payer: Self-pay | Admitting: Internal Medicine

## 2019-08-07 ENCOUNTER — Ambulatory Visit (INDEPENDENT_AMBULATORY_CARE_PROVIDER_SITE_OTHER): Payer: 59 | Admitting: Psychology

## 2019-08-07 DIAGNOSIS — F330 Major depressive disorder, recurrent, mild: Secondary | ICD-10-CM

## 2019-08-15 ENCOUNTER — Other Ambulatory Visit: Payer: Self-pay | Admitting: Internal Medicine

## 2019-08-15 NOTE — Telephone Encounter (Signed)
Last filled 07/15/2019...Marland Kitchen please advise

## 2019-08-17 ENCOUNTER — Other Ambulatory Visit: Payer: Self-pay | Admitting: Internal Medicine

## 2019-09-05 ENCOUNTER — Other Ambulatory Visit: Payer: Self-pay | Admitting: Internal Medicine

## 2019-09-11 ENCOUNTER — Other Ambulatory Visit: Payer: Self-pay | Admitting: Internal Medicine

## 2019-09-11 NOTE — Telephone Encounter (Signed)
Last filled 08/15/2019, pt has upcoming appt

## 2019-09-15 ENCOUNTER — Other Ambulatory Visit: Payer: Self-pay | Admitting: Internal Medicine

## 2019-09-18 ENCOUNTER — Ambulatory Visit (INDEPENDENT_AMBULATORY_CARE_PROVIDER_SITE_OTHER): Payer: 59 | Admitting: Psychology

## 2019-09-18 DIAGNOSIS — F330 Major depressive disorder, recurrent, mild: Secondary | ICD-10-CM | POA: Diagnosis not present

## 2019-09-24 ENCOUNTER — Other Ambulatory Visit (HOSPITAL_COMMUNITY)
Admission: RE | Admit: 2019-09-24 | Discharge: 2019-09-24 | Disposition: A | Discharge status: Home / Self Care | Payer: 59 | Source: Ambulatory Visit | Attending: Internal Medicine | Admitting: Internal Medicine

## 2019-09-24 ENCOUNTER — Encounter: Payer: Self-pay | Admitting: Internal Medicine

## 2019-09-24 ENCOUNTER — Other Ambulatory Visit: Payer: Self-pay

## 2019-09-24 ENCOUNTER — Ambulatory Visit (INDEPENDENT_AMBULATORY_CARE_PROVIDER_SITE_OTHER): Payer: 59 | Admitting: Internal Medicine

## 2019-09-24 VITALS — BP 130/84 | HR 77 | Temp 98.2°F | Ht 66.5 in | Wt 216.0 lb

## 2019-09-24 DIAGNOSIS — E781 Pure hyperglyceridemia: Secondary | ICD-10-CM

## 2019-09-24 DIAGNOSIS — Z79899 Other long term (current) drug therapy: Secondary | ICD-10-CM | POA: Diagnosis not present

## 2019-09-24 DIAGNOSIS — I10 Essential (primary) hypertension: Secondary | ICD-10-CM

## 2019-09-24 DIAGNOSIS — Z0000 Encounter for general adult medical examination without abnormal findings: Secondary | ICD-10-CM | POA: Diagnosis not present

## 2019-09-24 DIAGNOSIS — Z124 Encounter for screening for malignant neoplasm of cervix: Secondary | ICD-10-CM

## 2019-09-24 DIAGNOSIS — R7309 Other abnormal glucose: Secondary | ICD-10-CM

## 2019-09-24 DIAGNOSIS — K219 Gastro-esophageal reflux disease without esophagitis: Secondary | ICD-10-CM

## 2019-09-24 DIAGNOSIS — M170 Bilateral primary osteoarthritis of knee: Secondary | ICD-10-CM | POA: Diagnosis not present

## 2019-09-24 DIAGNOSIS — F419 Anxiety disorder, unspecified: Secondary | ICD-10-CM

## 2019-09-24 DIAGNOSIS — F32A Depression, unspecified: Secondary | ICD-10-CM

## 2019-09-24 DIAGNOSIS — F329 Major depressive disorder, single episode, unspecified: Secondary | ICD-10-CM

## 2019-09-24 DIAGNOSIS — B002 Herpesviral gingivostomatitis and pharyngotonsillitis: Secondary | ICD-10-CM

## 2019-09-24 LAB — CBC
HCT: 34.6 % — ABNORMAL LOW (ref 36.0–46.0)
Hemoglobin: 11.5 g/dL — ABNORMAL LOW (ref 12.0–15.0)
MCHC: 33.1 g/dL (ref 30.0–36.0)
MCV: 87.6 fl (ref 78.0–100.0)
Platelets: 240 10*3/uL (ref 150.0–400.0)
RBC: 3.95 Mil/uL (ref 3.87–5.11)
RDW: 14.1 % (ref 11.5–15.5)
WBC: 7.1 10*3/uL (ref 4.0–10.5)

## 2019-09-24 LAB — COMPREHENSIVE METABOLIC PANEL
ALT: 19 U/L (ref 0–35)
AST: 13 U/L (ref 0–37)
Albumin: 4 g/dL (ref 3.5–5.2)
Alkaline Phosphatase: 83 U/L (ref 39–117)
BUN: 12 mg/dL (ref 6–23)
CO2: 26 mEq/L (ref 19–32)
Calcium: 8.9 mg/dL (ref 8.4–10.5)
Chloride: 103 mEq/L (ref 96–112)
Creatinine, Ser: 0.63 mg/dL (ref 0.40–1.20)
GFR: 99.48 mL/min (ref >60.00)
Glucose, Bld: 79 mg/dL (ref 70–99)
Potassium: 3.8 mEq/L (ref 3.5–5.1)
Sodium: 137 mEq/L (ref 135–145)
Total Bilirubin: 0.2 mg/dL (ref 0.2–1.2)
Total Protein: 6.6 g/dL (ref 6.0–8.3)

## 2019-09-24 LAB — LIPID PANEL
Cholesterol: 195 mg/dL (ref 0–200)
HDL: 50.6 mg/dL (ref >39.00)
NonHDL: 144.07
Total CHOL/HDL Ratio: 4
Triglycerides: 223 mg/dL — ABNORMAL HIGH (ref 0.0–149.0)
VLDL: 44.6 mg/dL — ABNORMAL HIGH (ref 0.0–40.0)

## 2019-09-24 LAB — LDL CHOLESTEROL, DIRECT: Direct LDL: 112 mg/dL

## 2019-09-24 LAB — HEMOGLOBIN A1C: Hgb A1c MFr Bld: 5.7 % (ref 4.6–6.5)

## 2019-09-24 LAB — VITAMIN D 25 HYDROXY (VIT D DEFICIENCY, FRACTURES): VITD: 30.18 ng/mL (ref 30.00–100.00)

## 2019-09-24 NOTE — Patient Instructions (Signed)
Health Maintenance, Female Adopting a healthy lifestyle and getting preventive care are important in promoting health and wellness. Ask your health care provider about:  The right schedule for you to have regular tests and exams.  Things you can do on your own to prevent diseases and keep yourself healthy. What should I know about diet, weight, and exercise? Eat a healthy diet   Eat a diet that includes plenty of vegetables, fruits, low-fat dairy products, and lean protein.  Do not eat a lot of foods that are high in solid fats, added sugars, or sodium. Maintain a healthy weight Body mass index (BMI) is used to identify weight problems. It estimates body fat based on height and weight. Your health care provider can help determine your BMI and help you achieve or maintain a healthy weight. Get regular exercise Get regular exercise. This is one of the most important things you can do for your health. Most adults should:  Exercise for at least 150 minutes each week. The exercise should increase your heart rate and make you sweat (moderate-intensity exercise).  Do strengthening exercises at least twice a week. This is in addition to the moderate-intensity exercise.  Spend less time sitting. Even light physical activity can be beneficial. Watch cholesterol and blood lipids Have your blood tested for lipids and cholesterol at 52 years of age, then have this test every 5 years. Have your cholesterol levels checked more often if:  Your lipid or cholesterol levels are high.  You are older than 52 years of age.  You are at high risk for heart disease. What should I know about cancer screening? Depending on your health history and family history, you may need to have cancer screening at various ages. This may include screening for:  Breast cancer.  Cervical cancer.  Colorectal cancer.  Skin cancer.  Lung cancer. What should I know about heart disease, diabetes, and high blood  pressure? Blood pressure and heart disease  High blood pressure causes heart disease and increases the risk of stroke. This is more likely to develop in people who have high blood pressure readings, are of African descent, or are overweight.  Have your blood pressure checked: ? Every 3-5 years if you are 18-39 years of age. ? Every year if you are 40 years old or older. Diabetes Have regular diabetes screenings. This checks your fasting blood sugar level. Have the screening done:  Once every three years after age 40 if you are at a normal weight and have a low risk for diabetes.  More often and at a younger age if you are overweight or have a high risk for diabetes. What should I know about preventing infection? Hepatitis B If you have a higher risk for hepatitis B, you should be screened for this virus. Talk with your health care provider to find out if you are at risk for hepatitis B infection. Hepatitis C Testing is recommended for:  Everyone born from 1945 through 1965.  Anyone with known risk factors for hepatitis C. Sexually transmitted infections (STIs)  Get screened for STIs, including gonorrhea and chlamydia, if: ? You are sexually active and are younger than 52 years of age. ? You are older than 52 years of age and your health care provider tells you that you are at risk for this type of infection. ? Your sexual activity has changed since you were last screened, and you are at increased risk for chlamydia or gonorrhea. Ask your health care provider if   you are at risk.  Ask your health care provider about whether you are at high risk for HIV. Your health care provider may recommend a prescription medicine to help prevent HIV infection. If you choose to take medicine to prevent HIV, you should first get tested for HIV. You should then be tested every 3 months for as long as you are taking the medicine. Pregnancy  If you are about to stop having your period (premenopausal) and  you may become pregnant, seek counseling before you get pregnant.  Take 400 to 800 micrograms (mcg) of folic acid every day if you become pregnant.  Ask for birth control (contraception) if you want to prevent pregnancy. Osteoporosis and menopause Osteoporosis is a disease in which the bones lose minerals and strength with aging. This can result in bone fractures. If you are 65 years old or older, or if you are at risk for osteoporosis and fractures, ask your health care provider if you should:  Be screened for bone loss.  Take a calcium or vitamin D supplement to lower your risk of fractures.  Be given hormone replacement therapy (HRT) to treat symptoms of menopause. Follow these instructions at home: Lifestyle  Do not use any products that contain nicotine or tobacco, such as cigarettes, e-cigarettes, and chewing tobacco. If you need help quitting, ask your health care provider.  Do not use street drugs.  Do not share needles.  Ask your health care provider for help if you need support or information about quitting drugs. Alcohol use  Do not drink alcohol if: ? Your health care provider tells you not to drink. ? You are pregnant, may be pregnant, or are planning to become pregnant.  If you drink alcohol: ? Limit how much you use to 0-1 drink a day. ? Limit intake if you are breastfeeding.  Be aware of how much alcohol is in your drink. In the [place] one drink equals one 12 oz bottle of beer (355 mL), one 5 oz glass of wine (148 mL), or one 1 oz glass of hard liquor (44 mL). General instructions  Schedule regular health, dental, and eye exams.  Stay current with your vaccines.  Tell your health care provider if: ? You often feel depressed. ? You have ever been abused or do not feel safe at home. Summary  Adopting a healthy lifestyle and getting preventive care are important in promoting health and wellness.  Follow your health care provider's instructions about healthy  diet, exercising, and getting tested or screened for diseases.  Follow your health care provider's instructions on monitoring your cholesterol and blood pressure. This information is not intended to replace advice given to you by your health care provider. Make sure you discuss any questions you have with your health care provider. Document Revised: 06/07/2018 Document Reviewed: 06/07/2018 Elsevier Patient Education  2020 Else[employer] Inc[employer]  

## 2019-09-24 NOTE — Progress Notes (Signed)
Subjective:    Patient ID: Melissa Cowan, female    DOB: 09/06/1965, 54 y.o.   MRN: [id number]  HPI  Pt presents to the clinic today for her annual exam. She is also due to follow up chronic conditions.   HTN: Her BP today is 130/84. She is taking Lisinopril as prescribed. ECG from 12/2014 reviewed.  Cold Sores: Intermittent. She is not taking any oral antivirals at this time.   Anxiety and Depression: Chronic. Stable on Fluoxetine and Xanax. She is due for CSA and UDS today. She is not seeing a therapist. She denies SI/HI.  GERD: Triggered by caffeine, tomato based foods. She takes Prevacid as needed with good relief of symptoms. There is no upper GI on file.   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 She takes Ibuprofen as needed. She follows with orthopedics.  Flu: 02/2019 Tetanus: 11/2013 Covid: 08/2019 Pap Smear: 04/2016 Mammogram: 06/2019 Colon Screening: 01/2019 Vision Screening: as needed Dentist: biannually  Diet: She does eat meat. She consumes fruits and veggies daily. She tries to avoid fried foods. She drinks mostly water. Exercise: None  Review of Systems      Past Medical History:  Diagnosis Date  . Allergy   . Depression   . Endometriosis determined by laparoscopy 1993  . GERD (gastroesophageal reflux disease)   . Hypertension     Current Outpatient Medications  Medication Sig Dispense Refill  . ALPRAZolam (XANAX) 0.5 MG tablet Take 1 tablet (0.5 mg total) by mouth daily as needed for anxiety. 30 tablet 0  . fexofenadine (ALLEGRA) 180 MG tablet TAKE 1 TABLET (180 MG TOTAL) BY MOUTH DAILY. 30 tablet 5  . FLUoxetine (PROZAC) 20 MG capsule Take 2 capsules (40 mg total) by mouth daily. 60 capsule 0  . fluticasone (FLONASE) 50 MCG/ACT nasal spray Place 2 sprays into both nostrils daily. 16 g 0  . ibuprofen (ADVIL,MOTRIN) 200 MG tablet Take by mouth.    Marland Kitchen lisinopril (ZESTRIL) 20 MG tablet Take 1 tablet (20 mg total) by mouth daily. 30 tablet 0  . meloxicam (MOBIC) 7.5 MG  tablet Take 1 tablet by mouth daily.    . Omega-3 Fatty Acids (FISH OIL) 1000 MG CAPS Take 2 capsules by mouth 2 (two) times daily.     No current facility-administered medications for this visit.    Allergies  Allergen Reactions  . Amoxicillin-Pot Clavulanate Rash  . Tetracycline Rash    Family History  Problem Relation Age of Onset  . Alcohol abuse Mother   . Drug abuse Mother   . Cancer Mother        uterine  . Alcohol abuse Father   . Diabetes Father   . Stroke Maternal Grandmother   . Kidney disease Maternal Grandmother   . Breast cancer Neg Hx     Social History   Socioeconomic History  . Marital status: Divorced    Spouse name: Not on file  . Number of children: Not on file  . Years of education: Not on file  . Highest education level: Not on file  Occupational History  . Not on file  Tobacco Use  . Smoking status: Former Smoker    Types: Cigarettes  . Smokeless tobacco: Never Used  . Tobacco comment: quit x 15 years ago  Substance and Sexual Activity  . Alcohol use: Yes    Alcohol/week: 0.0 standard drinks    Comment: social  . Drug use: No  . Sexual activity: Not Currently  Other Topics Concern  .  Not on file  Social History Narrative  . Not on file   Social Determinants of Health   Financial Resource Strain:   . Difficulty of Paying Living Expenses:   Food Insecurity:   . Worried About Programme researcher, broadcasting/film/video in the Last Year:   . Barista in the Last Year:   Transportation Needs:   . Freight forwarder (Medical):   Marland Kitchen Lack of Transportation (Non-Medical):   Physical Activity:   . Days of Exercise per Week:   . Minutes of Exercise per Session:   Stress:   . Feeling of Stress :   Social Connections:   . Frequency of Communication with Friends and Family:   . Frequency of Social Gatherings with Friends and Family:   . Attends Religious Services:   . Active Member of Clubs or Organizations:   . Attends Banker Meetings:    Marland Kitchen Marital Status:   Intimate Partner Violence:   . Fear of Current or Ex-Partner:   . Emotionally Abused:   Marland Kitchen Physically Abused:   . Sexually Abused:      Constitutional: Denies fever, malaise, fatigue, headache or abrupt weight changes.  HEENT: Denies eye pain, eye redness, ear pain, ringing in the ears, wax buildup, runny nose, nasal congestion, bloody nose, or sore throat. Respiratory: Denies difficulty breathing, shortness of breath, cough or sputum production.   Cardiovascular: Denies chest pain, chest tightness, palpitations or swelling in the hands or feet.  Gastrointestinal: Denies abdominal pain, bloating, constipation, diarrhea or blood in the stool.  GU: Denies urgency, frequency, pain with urination, burning sensation, blood in urine, odor or discharge. Musculoskeletal: Pt reports intermittent joint pain. Denies decrease in range of motion, difficulty with gait, muscle pain or joint swelling.  Skin: Denies redness, rashes, lesions or ulcercations.  Neurological: Denies dizziness, difficulty with memory, difficulty with speech or problems with balance and coordination.  Psych: Pt has a history of anxiety and depression. Denies SI/HI.  No other specific complaints in a complete review of systems (except as listed in HPI above).  Objective:   Physical Exam   LMP 06/30/2018  Wt Readings from Last 3 Encounters:  02/01/19 210 lb (95.3 kg)  09/06/18 219 lb (99.3 kg)  07/21/18 216 lb (98 kg)    General: Appears herstated age, obese, in NAD. Skin: Warm, dry and intact. No rashesnoted. HEENT: Head: normal shape and size; Eyes: sclera white, no icterus, conjunctiva pink, PERRLA and EOMs intact;  Neck:  Neck supple, trachea midline. No masses, lumps or thyromegaly present.  Cardiovascular: Normal rate and rhythm. S1,S2 noted.  No murmur, rubs or gallops noted. No JVD or BLE edema. No carotid bruits noted. Pulmonary/Chest: Normal effort and positive vesicular breath sounds. No  respiratory distress. No wheezes, rales or ronchi noted.  Abdomen: Soft and nontender. Normal bowel sounds. No distention or masses noted. Liver, spleen and kidneys non palpable. Pelvic: Normal female anatomy. Skin tag noted of left inferior labia majora. Cervix without changes. No CMT. Adnexa non palpable. Musculoskeletal: Strength 5/5 BUE/BLE. No difficulty with gait.  Neurological: Alert and oriented. Cranial nerves II-XII grossly intact. Coordination normal.  Psychiatric: Mood and affect normal. Behavior is normal. Judgment and thought content normal.     BMET    Component Value Date/Time   NA 137 09/06/2018 1554   K 4.4 09/06/2018 1554   CL 103 09/06/2018 1554   CO2 24 09/06/2018 1554   GLUCOSE 76 09/06/2018 1554   BUN 18 09/06/2018  1554   CREATININE 0.80 09/06/2018 1554   CALCIUM 9.6 09/06/2018 1554    Lipid Panel     Component Value Date/Time   CHOL 220 (H) 09/06/2018 1554   TRIG 305.0 (H) 09/06/2018 1554   HDL 65.60 09/06/2018 1554   CHOLHDL 3 09/06/2018 1554   VLDL 61.0 (H) 09/06/2018 1554   LDLCALC 75 06/02/2017 1232    CBC    Component Value Date/Time   WBC 6.9 09/06/2018 1554   RBC 4.22 09/06/2018 1554   HGB 12.2 09/06/2018 1554   HCT 37.4 09/06/2018 1554   PLT 296.0 09/06/2018 1554   MCV 88.5 09/06/2018 1554   MCHC 32.7 09/06/2018 1554   RDW 13.7 09/06/2018 1554    Hgb A1C Lab Results  Component Value Date   HGBA1C 5.5 09/06/2018           Assessment & Plan:   Preventative Health Maintenance:  Flu shot UTD Tetanus UTD Needs 2nd Covid vaccine Pap smear today- she declines STD screening Mammogram UTD Colon screening UTD Encouraged her to consume a balanced diet and exercise regimen Advised her to see an eye doctor and dentist annually Will check CBC, CMET, Lipid, A1C and Vit D today  RTC in 1 year, sooner if needed  Webb Silversmith, NP This visit occurred during the SARS-CoV-2 public health emergency.  Safety protocols were in place,  including screening questions prior to the visit, additional usage of staff PPE, and extensive cleaning of exam room while observing appropriate contact time as indicated for disinfecting solutions.

## 2019-09-24 NOTE — Assessment & Plan Note (Signed)
Continue Fluoxetine and Xanax CSA and UDS today

## 2019-09-24 NOTE — Assessment & Plan Note (Signed)
No meds Will monitor 

## 2019-09-24 NOTE — Assessment & Plan Note (Signed)
CBC and CMET today Continue Prevacid as needed Avoid foods that trigger your reflux

## 2019-09-24 NOTE — Assessment & Plan Note (Signed)
Continue Ibuprofen as needed Encouraged regular physical exercise, weight loss

## 2019-09-24 NOTE — Addendum Note (Signed)
Addended by: Roena Malady on: 09/24/2019 03:07 PM   Modules accepted: Orders

## 2019-09-24 NOTE — Assessment & Plan Note (Signed)
Controlled on Lisinopril CMET today Encouraged DASH diet, exercise for weight loss

## 2019-09-25 ENCOUNTER — Encounter: Payer: Self-pay | Admitting: Internal Medicine

## 2019-09-26 LAB — PAIN MGMT, PROFILE 8 W/CONF, U
6 Acetylmorphine: NEGATIVE ng/mL
Alcohol Metabolites: NEGATIVE ng/mL (ref <500)
Amphetamines: NEGATIVE ng/mL
Benzodiazepines: NEGATIVE ng/mL
Benzoylecgonine: 1578 ng/mL
Buprenorphine, Urine: NEGATIVE ng/mL
Cocaine Metabolite: POSITIVE ng/mL
Creatinine: 16.9 mg/dL
MDMA: NEGATIVE ng/mL
Marijuana Metabolite: NEGATIVE ng/mL
Opiates: NEGATIVE ng/mL
Oxidant: NEGATIVE ug/mL
Oxycodone: NEGATIVE ng/mL
Specific Gravity: 1.003 (ref >=1.0)
pH: 6.9 (ref 4.5–9.0)

## 2019-09-26 LAB — CYTOLOGY - PAP
Adequacy: ABSENT
Comment: NEGATIVE
Diagnosis: NEGATIVE
High risk HPV: NEGATIVE

## 2019-09-26 NOTE — Addendum Note (Signed)
Addended by: Roena Malady on: 09/26/2019 04:33 PM   Modules accepted: Orders

## 2019-09-29 ENCOUNTER — Other Ambulatory Visit: Payer: Self-pay | Admitting: Internal Medicine

## 2019-10-11 ENCOUNTER — Other Ambulatory Visit: Payer: Self-pay | Admitting: Internal Medicine

## 2019-11-13 ENCOUNTER — Ambulatory Visit: Payer: 59 | Admitting: Psychology

## 2019-11-15 ENCOUNTER — Ambulatory Visit (INDEPENDENT_AMBULATORY_CARE_PROVIDER_SITE_OTHER): Payer: 59 | Admitting: Psychology

## 2019-11-15 DIAGNOSIS — F331 Major depressive disorder, recurrent, moderate: Secondary | ICD-10-CM | POA: Diagnosis not present

## 2019-12-01 ENCOUNTER — Other Ambulatory Visit: Payer: Self-pay | Admitting: Internal Medicine

## 2019-12-25 ENCOUNTER — Ambulatory Visit (INDEPENDENT_AMBULATORY_CARE_PROVIDER_SITE_OTHER): Payer: 59 | Admitting: Psychology

## 2019-12-25 DIAGNOSIS — F331 Major depressive disorder, recurrent, moderate: Secondary | ICD-10-CM | POA: Diagnosis not present

## 2020-01-26 ENCOUNTER — Other Ambulatory Visit: Payer: Self-pay | Admitting: Internal Medicine

## 2020-02-05 ENCOUNTER — Ambulatory Visit (INDEPENDENT_AMBULATORY_CARE_PROVIDER_SITE_OTHER): Payer: 59 | Admitting: Psychology

## 2020-02-05 DIAGNOSIS — F330 Major depressive disorder, recurrent, mild: Secondary | ICD-10-CM | POA: Diagnosis not present

## 2020-02-29 ENCOUNTER — Other Ambulatory Visit: Payer: Self-pay | Admitting: Internal Medicine

## 2020-03-04 ENCOUNTER — Ambulatory Visit (INDEPENDENT_AMBULATORY_CARE_PROVIDER_SITE_OTHER): Payer: 59 | Admitting: Psychology

## 2020-03-04 DIAGNOSIS — F330 Major depressive disorder, recurrent, mild: Secondary | ICD-10-CM | POA: Diagnosis not present

## 2020-03-07 ENCOUNTER — Encounter: Payer: Self-pay | Admitting: Internal Medicine

## 2020-04-24 ENCOUNTER — Ambulatory Visit (INDEPENDENT_AMBULATORY_CARE_PROVIDER_SITE_OTHER): Payer: 59 | Admitting: Psychology

## 2020-04-24 DIAGNOSIS — F330 Major depressive disorder, recurrent, mild: Secondary | ICD-10-CM

## 2020-04-30 ENCOUNTER — Other Ambulatory Visit: Payer: Self-pay | Admitting: Internal Medicine

## 2020-06-05 ENCOUNTER — Ambulatory Visit: Payer: 59 | Admitting: Psychology

## 2020-06-30 ENCOUNTER — Other Ambulatory Visit: Payer: Self-pay | Admitting: Internal Medicine

## 2020-07-01 ENCOUNTER — Ambulatory Visit (INDEPENDENT_AMBULATORY_CARE_PROVIDER_SITE_OTHER): Payer: 59 | Admitting: Psychology

## 2020-07-01 DIAGNOSIS — F330 Major depressive disorder, recurrent, mild: Secondary | ICD-10-CM

## 2020-07-26 ENCOUNTER — Other Ambulatory Visit: Payer: Self-pay | Admitting: Internal Medicine

## 2020-08-07 ENCOUNTER — Ambulatory Visit (INDEPENDENT_AMBULATORY_CARE_PROVIDER_SITE_OTHER): Payer: 59 | Admitting: Psychology

## 2020-08-07 DIAGNOSIS — F330 Major depressive disorder, recurrent, mild: Secondary | ICD-10-CM

## 2020-08-26 ENCOUNTER — Other Ambulatory Visit: Payer: Self-pay | Admitting: Internal Medicine

## 2020-09-09 ENCOUNTER — Ambulatory Visit (INDEPENDENT_AMBULATORY_CARE_PROVIDER_SITE_OTHER): Payer: 59 | Admitting: Psychology

## 2020-09-09 DIAGNOSIS — F330 Major depressive disorder, recurrent, mild: Secondary | ICD-10-CM | POA: Diagnosis not present

## 2020-09-23 ENCOUNTER — Other Ambulatory Visit: Payer: Self-pay | Admitting: Internal Medicine

## 2020-10-15 ENCOUNTER — Other Ambulatory Visit: Payer: Self-pay | Admitting: Internal Medicine

## 2020-10-15 DIAGNOSIS — Z1231 Encounter for screening mammogram for malignant neoplasm of breast: Secondary | ICD-10-CM

## 2020-10-21 ENCOUNTER — Ambulatory Visit (INDEPENDENT_AMBULATORY_CARE_PROVIDER_SITE_OTHER): Payer: 59 | Admitting: Psychology

## 2020-10-21 DIAGNOSIS — F321 Major depressive disorder, single episode, moderate: Secondary | ICD-10-CM | POA: Diagnosis not present

## 2020-10-24 ENCOUNTER — Ambulatory Visit
Admission: RE | Admit: 2020-10-24 | Discharge: 2020-10-24 | Disposition: A | Discharge status: Home / Self Care | Payer: 59 | Source: Ambulatory Visit | Attending: Internal Medicine | Admitting: Internal Medicine

## 2020-10-24 ENCOUNTER — Other Ambulatory Visit: Payer: Self-pay

## 2020-10-24 DIAGNOSIS — Z1231 Encounter for screening mammogram for malignant neoplasm of breast: Secondary | ICD-10-CM | POA: Insufficient documentation

## 2020-11-27 ENCOUNTER — Ambulatory Visit (INDEPENDENT_AMBULATORY_CARE_PROVIDER_SITE_OTHER): Payer: 59 | Admitting: Psychology

## 2020-11-27 DIAGNOSIS — F321 Major depressive disorder, single episode, moderate: Secondary | ICD-10-CM | POA: Diagnosis not present

## 2020-12-08 ENCOUNTER — Ambulatory Visit: Payer: Self-pay | Admitting: Internal Medicine

## 2020-12-11 ENCOUNTER — Ambulatory Visit (INDEPENDENT_AMBULATORY_CARE_PROVIDER_SITE_OTHER): Payer: 59 | Admitting: Internal Medicine

## 2020-12-11 ENCOUNTER — Other Ambulatory Visit: Payer: Self-pay

## 2020-12-11 ENCOUNTER — Encounter: Payer: Self-pay | Admitting: Internal Medicine

## 2020-12-11 VITALS — BP 124/76 | HR 78 | Temp 97.3°F | Resp 17 | Ht 66.5 in | Wt 212.0 lb

## 2020-12-11 DIAGNOSIS — B002 Herpesviral gingivostomatitis and pharyngotonsillitis: Secondary | ICD-10-CM | POA: Diagnosis not present

## 2020-12-11 DIAGNOSIS — F32A Depression, unspecified: Secondary | ICD-10-CM

## 2020-12-11 DIAGNOSIS — M170 Bilateral primary osteoarthritis of knee: Secondary | ICD-10-CM | POA: Diagnosis not present

## 2020-12-11 DIAGNOSIS — E6609 Other obesity due to excess calories: Secondary | ICD-10-CM | POA: Insufficient documentation

## 2020-12-11 DIAGNOSIS — F419 Anxiety disorder, unspecified: Secondary | ICD-10-CM

## 2020-12-11 DIAGNOSIS — I10 Essential (primary) hypertension: Secondary | ICD-10-CM

## 2020-12-11 DIAGNOSIS — K219 Gastro-esophageal reflux disease without esophagitis: Secondary | ICD-10-CM | POA: Diagnosis not present

## 2020-12-11 DIAGNOSIS — Z6830 Body mass index (BMI) 30.0-30.9, adult: Secondary | ICD-10-CM

## 2020-12-11 DIAGNOSIS — E66811 Obesity, class 1: Secondary | ICD-10-CM | POA: Insufficient documentation

## 2020-12-11 MED ORDER — BUPROPION HCL ER (XL) 150 MG PO TB24: 150.0000 mg | ORAL_TABLET | Freq: Every day | ORAL | 1 refills | Status: DC

## 2020-12-11 MED ORDER — LISINOPRIL 20 MG PO TABS: 1.0000 | ORAL_TABLET | Freq: Every day | ORAL | 1 refills | Status: DC

## 2020-12-11 MED ORDER — FLUOXETINE HCL 20 MG PO CAPS: 40.0000 mg | ORAL_CAPSULE | Freq: Every day | ORAL | 1 refills | Status: DC

## 2020-12-11 NOTE — Patient Instructions (Signed)

## 2020-12-11 NOTE — Assessment & Plan Note (Signed)
Encourage diet and exercise for weight loss 

## 2020-12-11 NOTE — Progress Notes (Signed)
Subjective:    Patient ID: Melissa Cowan, female    DOB: 08/17/1965, 55 y.o.   MRN: [id number]  HPI  Patient presents the clinic today for follow-up of chronic conditions.  HTN: Her BP today is 124/76.  She is taking Lisinopril as prescribed.  ECG from 12/2014 reviewed.  Cold Sores: Intermittent, she is not taking any oral antivirals at this time.  GERD: Triggered by caffeine, tomato-based foods.  She takes Prevacid OTC as needed with good relief of symptoms.  There is no upper GI on file.  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 Improved with orthotics. She no longer follow with orthopedics.  Anxiety and Depression: Deteriorated, on Fluoxetine.  This is being triggered by her fathers declining health. She does see a therapist.  She denies SI/HI.  She is due for CSA today.  Review of Systems     Past Medical History:  Diagnosis Date   Allergy    Depression    Endometriosis determined by laparoscopy 1993   GERD (gastroesophageal reflux disease)    Hypertension     Current Outpatient Medications  Medication Sig Dispense Refill   ALPRAZolam (XANAX) 0.5 MG tablet Take 1 tablet (0.5 mg total) by mouth daily as needed for anxiety. 30 tablet 0   fexofenadine (ALLEGRA) 180 MG tablet Take 180 mg by mouth daily.     FLUoxetine (PROZAC) 20 MG capsule TAKE 2 CAPSULES BY MOUTH EVERY DAY 180 capsule 1   fluticasone (FLONASE) 50 MCG/ACT nasal spray Place 2 sprays into both nostrils daily. 16 g 0   ibuprofen (ADVIL,MOTRIN) 200 MG tablet Take by mouth.     lisinopril (ZESTRIL) 20 MG tablet TAKE 1 TABLET BY MOUTH EVERY DAY 90 tablet 0   Omega-3 Fatty Acids (FISH OIL) 1000 MG CAPS Take 2 capsules by mouth 2 (two) times daily.     No current facility-administered medications for this visit.    Allergies  Allergen Reactions   Amoxicillin-Pot Clavulanate Rash   Tetracycline Rash    Family History  Problem Relation Age of Onset   Alcohol abuse Mother    Drug abuse Mother    Cancer Mother         uterine   Alcohol abuse Father    Diabetes Father    Stroke Maternal Grandmother    Kidney disease Maternal Grandmother    Breast cancer Neg Hx     Social History   Socioeconomic History   Marital status: Divorced    Spouse name: Not on file   Number of children: Not on file   Years of education: Not on file   Highest education level: Not on file  Occupational History   Not on file  Tobacco Use   Smoking status: Former    Pack years: 0.00    Types: Cigarettes   Smokeless tobacco: Never   Tobacco comments:    quit x 15 years ago  Substance and Sexual Activity   Alcohol use: Yes    Alcohol/week: 0.0 standard drinks    Comment: social   Drug use: No   Sexual activity: Not Currently  Other Topics Concern   Not on file  Social History Narrative   Not on file   Social Determinants of Health   Financial Resource Strain: Not on file  Food Insecurity: Not on file  Transportation Needs: Not on file  Physical Activity: Not on file  Stress: Not on file  Social Connections: Not on file  Intimate Partner Violence: Not on file  Constitutional: Denies fever, malaise, fatigue, headache or abrupt weight changes.  HEENT: Denies eye pain, eye redness, ear pain, ringing in the ears, wax buildup, runny nose, nasal congestion, bloody nose, or sore throat. Respiratory: Denies difficulty breathing, shortness of breath, cough or sputum production.   Cardiovascular: Denies chest pain, chest tightness, palpitations or swelling in the hands or feet.  Gastrointestinal: Patient reports intermittent reflux.  Denies abdominal pain, bloating, constipation, diarrhea or blood in the stool.  GU: Denies urgency, frequency, pain with urination, burning sensation, blood in urine, odor or discharge. Musculoskeletal: Denies decrease in range of motion, difficulty with gait, muscle pain or joint pain or swelling.  Skin: Denies redness, rashes, lesions or ulcercations.  Neurological: Denies dizziness,  difficulty with memory, difficulty with speech or problems with balance and coordination.  Psych: Patient has a history of anxiety and depression.  Denies SI/HI.  No other specific complaints in a complete review of systems (except as listed in HPI above).  Objective:   Physical Exam  BP 124/76 (BP Location: Left Arm, Patient Position: Sitting, Cuff Size: Large)   Pulse 78   Temp (!) 97.3 F (36.3 C) (Temporal)   Resp 17   Ht 5' 6.5" (1.689 m)   Wt 212 lb (96.2 kg)   LMP 06/30/2018   SpO2 100%   BMI 33.71 kg/m   Wt Readings from Last 3 Encounters:  09/24/19 216 lb (98 kg)  02/01/19 210 lb (95.3 kg)  09/06/18 219 lb (99.3 kg)    General: Appears her stated age, obese, in NAD. Skin: Warm, dry and intact.  Abrasion noted to right upper extremity. HEENT: Head: normal shape and size; Eyes: sclera white and EOMs intact;  Cardiovascular: Normal rate and rhythm. S1,S2 noted.  No murmur, rubs or gallops noted. No JVD or BLE edema.  Pulmonary/Chest: Normal effort and positive vesicular breath sounds. No respiratory distress. No wheezes, rales or ronchi noted.  Musculoskeletal: No difficulty with gait.  Neurological: Alert and oriented. Cranial nerves II-XII grossly intact. Coordination normal.  Psychiatric: Mood and affect mildly flat.  Tearful. Judgment and thought content normal.     BMET    Component Value Date/Time   NA 137 09/24/2019 1511   K 3.8 09/24/2019 1511   CL 103 09/24/2019 1511   CO2 26 09/24/2019 1511   GLUCOSE 79 09/24/2019 1511   BUN 12 09/24/2019 1511   CREATININE 0.63 09/24/2019 1511   CALCIUM 8.9 09/24/2019 1511    Lipid Panel     Component Value Date/Time   CHOL 195 09/24/2019 1511   TRIG 223.0 (H) 09/24/2019 1511   HDL 50.60 09/24/2019 1511   CHOLHDL 4 09/24/2019 1511   VLDL 44.6 (H) 09/24/2019 1511   LDLCALC 75 06/02/2017 1232    CBC    Component Value Date/Time   WBC 7.1 09/24/2019 1511   RBC 3.95 09/24/2019 1511   HGB 11.5 (L)  09/24/2019 1511   HCT 34.6 (L) 09/24/2019 1511   PLT 240.0 09/24/2019 1511   MCV 87.6 09/24/2019 1511   MCHC 33.1 09/24/2019 1511   RDW 14.1 09/24/2019 1511    Hgb A1C Lab Results  Component Value Date   HGBA1C 5.7 09/24/2019           Assessment & Plan:   Nicki Reaper, NP This visit occurred during the SARS-CoV-2 public health emergency.  Safety protocols were in place, including screening questions prior to the visit, additional usage of staff PPE, and extensive cleaning of exam room while observing  appropriate contact time as indicated for disinfecting solutions.

## 2020-12-11 NOTE — Assessment & Plan Note (Signed)
Currently not an issue 

## 2020-12-11 NOTE — Assessment & Plan Note (Signed)
Avoid foods that trigger reflux Continue Prevacid OTC as needed Encouraged weight loss as this can help reduce reflux symptoms

## 2020-12-11 NOTE — Assessment & Plan Note (Signed)
Deteriorated Continue FLuoxetine We will add Wellbutrin Support offered  Update me in 1 month via MyChart and let me know how you are doing

## 2020-12-11 NOTE — Assessment & Plan Note (Signed)
Controlled on Lisinopril, refilled today Reinforced DASH diet and exercise weight loss

## 2020-12-11 NOTE — Assessment & Plan Note (Signed)
She will continue to wear her orthotics Encouraged weight loss as this can help reduce joint pain

## 2020-12-18 ENCOUNTER — Encounter: Payer: Self-pay | Admitting: Internal Medicine

## 2020-12-25 ENCOUNTER — Ambulatory Visit (INDEPENDENT_AMBULATORY_CARE_PROVIDER_SITE_OTHER): Payer: 59 | Admitting: Psychology

## 2020-12-25 DIAGNOSIS — F321 Major depressive disorder, single episode, moderate: Secondary | ICD-10-CM | POA: Diagnosis not present

## 2021-01-20 ENCOUNTER — Ambulatory Visit: Payer: 59 | Admitting: Psychology

## 2021-01-26 ENCOUNTER — Ambulatory Visit (INDEPENDENT_AMBULATORY_CARE_PROVIDER_SITE_OTHER): Payer: 59 | Admitting: Psychology

## 2021-01-26 DIAGNOSIS — F331 Major depressive disorder, recurrent, moderate: Secondary | ICD-10-CM

## 2021-02-06 ENCOUNTER — Encounter: Payer: Self-pay | Admitting: Internal Medicine

## 2021-02-06 DIAGNOSIS — F32A Depression, unspecified: Secondary | ICD-10-CM

## 2021-02-06 DIAGNOSIS — F419 Anxiety disorder, unspecified: Secondary | ICD-10-CM

## 2021-02-06 MED ORDER — HYDROXYZINE HCL 25 MG PO TABS: 25.0000 mg | ORAL_TABLET | Freq: Three times a day (TID) | ORAL | 0 refills | Status: DC | PRN

## 2021-02-20 ENCOUNTER — Ambulatory Visit (INDEPENDENT_AMBULATORY_CARE_PROVIDER_SITE_OTHER): Payer: 59 | Admitting: Psychology

## 2021-02-20 DIAGNOSIS — F331 Major depressive disorder, recurrent, moderate: Secondary | ICD-10-CM

## 2021-03-09 ENCOUNTER — Ambulatory Visit (INDEPENDENT_AMBULATORY_CARE_PROVIDER_SITE_OTHER): Payer: 59 | Admitting: Psychology

## 2021-03-09 DIAGNOSIS — F330 Major depressive disorder, recurrent, mild: Secondary | ICD-10-CM

## 2021-03-24 ENCOUNTER — Ambulatory Visit: Payer: 59 | Admitting: Psychology

## 2021-04-06 ENCOUNTER — Ambulatory Visit (INDEPENDENT_AMBULATORY_CARE_PROVIDER_SITE_OTHER): Payer: 59 | Admitting: Psychology

## 2021-04-06 DIAGNOSIS — F331 Major depressive disorder, recurrent, moderate: Secondary | ICD-10-CM | POA: Diagnosis not present

## 2021-05-15 ENCOUNTER — Other Ambulatory Visit: Payer: Self-pay | Admitting: Family Medicine

## 2021-05-15 DIAGNOSIS — F419 Anxiety disorder, unspecified: Secondary | ICD-10-CM

## 2021-05-15 DIAGNOSIS — F32A Depression, unspecified: Secondary | ICD-10-CM

## 2021-05-17 MED ORDER — HYDROXYZINE HCL 25 MG PO TABS: 25.0000 mg | ORAL_TABLET | Freq: Three times a day (TID) | ORAL | 0 refills | Status: DC | PRN

## 2021-06-01 ENCOUNTER — Other Ambulatory Visit: Payer: Self-pay | Admitting: Internal Medicine

## 2021-06-01 ENCOUNTER — Encounter: Payer: 59 | Admitting: Internal Medicine

## 2021-06-01 NOTE — Telephone Encounter (Signed)
Requested Prescriptions  Pending Prescriptions Disp Refills  . buPROPion (WELLBUTRIN XL) 150 MG 24 hr tablet [Pharmacy Med Name: BUPROPION HCL XL 150 MG TABLET] 30 tablet 5    Sig: TAKE 1 TABLET BY MOUTH EVERY DAY     Psychiatry: Antidepressants - bupropion Passed - 06/01/2021  1:27 AM      Passed - Completed PHQ-2 or PHQ-9 in the last 360 days      Passed - Last BP in normal range    BP Readings from Last 1 Encounters:  12/11/20 124/76         Passed - Valid encounter within last 6 months    Recent Outpatient Visits          5 months ago Primary hypertension   [facility] [place], Salvadore Oxford, NP      Future Appointments            In 1 month [place], Salvadore Oxford, NP [facility], [facility]

## 2021-06-11 ENCOUNTER — Ambulatory Visit (INDEPENDENT_AMBULATORY_CARE_PROVIDER_SITE_OTHER): Payer: 59 | Admitting: Psychology

## 2021-06-11 DIAGNOSIS — F331 Major depressive disorder, recurrent, moderate: Secondary | ICD-10-CM | POA: Diagnosis not present

## 2021-06-11 NOTE — Progress Notes (Signed)
[facility] Counselor/Therapist Progress Note  Patient ID: Melissa Cowan, MRN: [id number],    Date: 06/11/2021  Time Spent: 50 minutes  Treatment Type: Individual Therapy  Reported Symptoms: depression and anxiety  Mental Status Exam: Appearance:  Casual     Behavior: Appropriate  Motor: Normal  Speech/Language:  Normal Rate  Affect: Appropriate  Mood: normal  Thought process: normal  Thought content:   WNL  Sensory/Perceptual disturbances:   WNL  Orientation: oriented to person, place, time/date, and situation  Attention: Good  Concentration: Good  Memory: WNL  Fund of knowledge:  Good  Insight:   Good  Judgment:  Good  Impulse Control: Good   Risk Assessment: Danger to Self:  No Self-injurious Behavior: No Danger to Others: No Duty to Warn:no Physical Aggression / Violence:No  Access to Firearms a concern: No  Gang Involvement:No   Subjective: The patient attended a face-to-face individual therapy session via video visit today.  The patient gave verbal consent for the video visit to be on WebEx.  The patient was in her home alone and the therapist was in her office.  The patient reports that she had surgery last Wednesday on her rotator cuff and she is recovering.  The patient reports that she has been doing relatively well emotionally since her father passed away.  The patient talked about her current relationship and it seems that she is not wanting to go any further.  We talked about expectations and the patient seems to have realistic expectations concerning this relationship.  The patient is doing very well at present we scheduled a session for a month out.  Interventions: Cognitive Behavioral Therapy  Diagnosis:Major depressive disorder, recurrent episode, moderate (HCC)  Plan: Treatment Plan  Strengths/Abilities:  Intelligent, caring, ability for insight, motivated   Treatment Preferences:  Outpatient Individual therapy  Statement of Needs:   " I need help with depression and anxiety"  Symptoms:  Depressed or irritable mood.: (Status: improved). Diminished interest in or  enjoyment of activities.: (Status: improved). History of chronic or recurrent  depression for which the client has taken antidepressant medication, been hospitalized, had outpatient  treatment, or had a course of electroconvulsive therapy.: (Status: maintained).  Lack of energy.: (Status: improved). Motor tension (e.g., restlessness,  tiredness, shakiness, muscle tension).: (Status: improved). Poor concentration  and indecisiveness.: (Status: improved). Social withdrawal.:  (Status: maintained).   Problems Addressed: Unipolar depression and anxiety  Goals: 1. Alleviate depressive symptoms and return to previous level of effective  Functioning Target Date: 2021-03-20 Frequency: Monthly Progress: 40 Modality: individual 2. Appropriately grieve the loss in order to normalize mood and to return  to previously adaptive level of functioning Target Date: 2021-03-20 Frequency: Monthly Progress: 70 Modality: individual 3. Develop healthy interpersonal relationships that lead to the alleviation  and help prevent the relapse of depression. 4. Develop healthy thinking patterns and beliefs about self, others, and the world that lead to the alleviation and help prevent the relapse of  depression. 5. Recognize, accept, and cope with feelings of depression. 6. Stabilize anxiety level while increasing ability to function on a daily  basis. Objective Learn and implement calming skills to reduce overall anxiety and manage anxiety symptoms. Target Date: 2021-03-20 Frequency: Monthly Progress: 30 Modality: individual    Interventions by Therapist:  CBT, insight oriented approach, grief counseling,  problem solving  Burgandy Hackworth G [place], LCSW

## 2021-07-02 ENCOUNTER — Ambulatory Visit (INDEPENDENT_AMBULATORY_CARE_PROVIDER_SITE_OTHER): Payer: 59 | Admitting: Internal Medicine

## 2021-07-02 ENCOUNTER — Encounter: Payer: Self-pay | Admitting: Internal Medicine

## 2021-07-02 ENCOUNTER — Other Ambulatory Visit: Payer: Self-pay | Admitting: Internal Medicine

## 2021-07-02 ENCOUNTER — Other Ambulatory Visit: Payer: Self-pay

## 2021-07-02 VITALS — BP 134/80 | HR 51 | Temp 97.1°F | Resp 17 | Ht 66.5 in | Wt 207.8 lb

## 2021-07-02 DIAGNOSIS — Z114 Encounter for screening for human immunodeficiency virus [HIV]: Secondary | ICD-10-CM | POA: Diagnosis not present

## 2021-07-02 DIAGNOSIS — E6609 Other obesity due to excess calories: Secondary | ICD-10-CM

## 2021-07-02 DIAGNOSIS — Z6833 Body mass index (BMI) 33.0-33.9, adult: Secondary | ICD-10-CM

## 2021-07-02 DIAGNOSIS — D649 Anemia, unspecified: Secondary | ICD-10-CM | POA: Insufficient documentation

## 2021-07-02 DIAGNOSIS — Z1159 Encounter for screening for other viral diseases: Secondary | ICD-10-CM | POA: Diagnosis not present

## 2021-07-02 DIAGNOSIS — R7303 Prediabetes: Secondary | ICD-10-CM | POA: Insufficient documentation

## 2021-07-02 DIAGNOSIS — Z0001 Encounter for general adult medical examination with abnormal findings: Secondary | ICD-10-CM

## 2021-07-02 DIAGNOSIS — K224 Dyskinesia of esophagus: Secondary | ICD-10-CM | POA: Diagnosis not present

## 2021-07-02 MED ORDER — OMEPRAZOLE 20 MG PO CPDR: 20.0000 mg | DELAYED_RELEASE_CAPSULE | Freq: Every day | ORAL | 1 refills | Status: DC

## 2021-07-02 NOTE — Patient Instructions (Signed)
Health Maintenance for Postmenopausal Women ?Menopause is a normal process in which your ability to get pregnant comes to an end. This process happens slowly over many months or years, usually between the ages of 48 and 55. Menopause is complete when you have missed your menstrual period for 12 months. ?It is important to talk with your health care provider about some of the most common conditions that affect women after menopause (postmenopausal women). These include heart disease, cancer, and bone loss (osteoporosis). Adopting a healthy lifestyle and getting preventive care can help to promote your health and wellness. The actions you take can also lower your chances of developing some of these common conditions. ?What are the signs and symptoms of menopause? ?During menopause, you may have the following symptoms: ?Hot flashes. These can be moderate or severe. ?Night sweats. ?Decrease in sex drive. ?Mood swings. ?Headaches. ?Tiredness (fatigue). ?Irritability. ?Memory problems. ?Problems falling asleep or staying asleep. ?Talk with your health care provider about treatment options for your symptoms. ?Do I need hormone replacement therapy? ?Hormone replacement therapy is effective in treating symptoms that are caused by menopause, such as hot flashes and night sweats. ?Hormone replacement carries certain risks, especially as you become older. If you are thinking about using estrogen or estrogen with progestin, discuss the benefits and risks with your health care provider. ?How can I reduce my risk for heart disease and stroke? ?The risk of heart disease, heart attack, and stroke increases as you age. One of the causes may be a change in the body's hormones during menopause. This can affect how your body uses dietary fats, triglycerides, and cholesterol. Heart attack and stroke are medical emergencies. There are many things that you can do to help prevent heart disease and stroke. ?Watch your blood pressure ?High  blood pressure causes heart disease and increases the risk of stroke. This is more likely to develop in people who have high blood pressure readings or are overweight. ?Have your blood pressure checked: ?Every 3-5 years if you are 18-39 years of age. ?Every year if you are 40 years old or older. ?Eat a healthy diet ? ?Eat a diet that includes plenty of vegetables, fruits, low-fat dairy products, and lean protein. ?Do not eat a lot of foods that are high in solid fats, added sugars, or sodium. ?Get regular exercise ?Get regular exercise. This is one of the most important things you can do for your health. Most adults should: ?Try to exercise for at least 150 minutes each week. The exercise should increase your heart rate and make you sweat (moderate-intensity exercise). ?Try to do strengthening exercises at least twice each week. Do these in addition to the moderate-intensity exercise. ?Spend less time sitting. Even light physical activity can be beneficial. ?Other tips ?Work with your health care provider to achieve or maintain a healthy weight. ?Do not use any products that contain nicotine or tobacco. These products include cigarettes, chewing tobacco, and vaping devices, such as e-cigarettes. If you need help quitting, ask your health care provider. ?Know your numbers. Ask your health care provider to check your cholesterol and your blood sugar (glucose). Continue to have your blood tested as directed by your health care provider. ?Do I need screening for cancer? ?Depending on your health history and family history, you may need to have cancer screenings at different stages of your life. This may include screening for: ?Breast cancer. ?Cervical cancer. ?Lung cancer. ?Colorectal cancer. ?What is my risk for osteoporosis? ?After menopause, you may be   at increased risk for osteoporosis. Osteoporosis is a condition in which bone destruction happens more quickly than new bone creation. To help prevent osteoporosis or  the bone fractures that can happen because of osteoporosis, you may take the following actions: ?If you are 19-50 years old, get at least 1,000 mg of calcium and at least 600 international units (IU) of vitamin D per day. ?If you are older than age 50 but younger than age 70, get at least 1,200 mg of calcium and at least 600 international units (IU) of vitamin D per day. ?If you are older than age 70, get at least 1,200 mg of calcium and at least 800 international units (IU) of vitamin D per day. ?Smoking and drinking excessive alcohol increase the risk of osteoporosis. Eat foods that are rich in calcium and vitamin D, and do weight-bearing exercises several times each week as directed by your health care provider. ?How does menopause affect my mental health? ?Depression may occur at any age, but it is more common as you become older. Common symptoms of depression include: ?Feeling depressed. ?Changes in sleep patterns. ?Changes in appetite or eating patterns. ?Feeling an overall lack of motivation or enjoyment of activities that you previously enjoyed. ?Frequent crying spells. ?Talk with your health care provider if you think that you are experiencing any of these symptoms. ?General instructions ?See your health care provider for regular wellness exams and vaccines. This may include: ?Scheduling regular health, dental, and eye exams. ?Getting and maintaining your vaccines. These include: ?Influenza vaccine. Get this vaccine each year before the flu season begins. ?Pneumonia vaccine. ?Shingles vaccine. ?Tetanus, diphtheria, and pertussis (Tdap) booster vaccine. ?Your health care provider may also recommend other immunizations. ?Tell your health care provider if you have ever been abused or do not feel safe at home. ?Summary ?Menopause is a normal process in which your ability to get pregnant comes to an end. ?This condition causes hot flashes, night sweats, decreased interest in sex, mood swings, headaches, or lack  of sleep. ?Treatment for this condition may include hormone replacement therapy. ?Take actions to keep yourself healthy, including exercising regularly, eating a healthy diet, watching your weight, and checking your blood pressure and blood sugar levels. ?Get screened for cancer and depression. Make sure that you are up to date with all your vaccines. ?This information is not intended to replace advice given to you by your health care provider. Make sure you discuss any questions you have with your health care provider. ?Document Revised: 11/03/2020 Document Reviewed: 11/03/2020 ?Elsevier Patient Education ? 2022 Elsevier Inc. ? ?

## 2021-07-02 NOTE — Assessment & Plan Note (Signed)
Encouraged diet and exercise for weight loss ?

## 2021-07-02 NOTE — Progress Notes (Signed)
Subjective:    Patient ID: Melissa Cowan, female    DOB: Jul 07, 1965, 56 y.o.   MRN: [id number]  HPI  Pt presents to the clinic today for her annual exam.  Flu: 02/2021 Tetanus: 11/2013 Covid: Pfizer x 3 Shingrix: never Pap smear: 08/2019 Mammogram: 09/2020 Colon screening: 01/2019 Vision screening: annually Dentist: as needed  Diet: She does eat meat. She consumes fruits and veggies. She does eat some fried foods. She drinks mostly water, some dt. soda Exercise: None  Review of Systems  Past Medical History:  Diagnosis Date   Allergy    Depression    Endometriosis determined by laparoscopy 1993   GERD (gastroesophageal reflux disease)    Hypertension     Current Outpatient Medications  Medication Sig Dispense Refill   Black Cohosh 200 MG CAPS Take by mouth.     buPROPion (WELLBUTRIN XL) 150 MG 24 hr tablet TAKE 1 TABLET BY MOUTH EVERY DAY 30 tablet 5   fexofenadine (ALLEGRA) 180 MG tablet Take 180 mg by mouth daily.     FLUoxetine (PROZAC) 20 MG capsule Take 2 capsules (40 mg total) by mouth daily. 180 capsule 1   fluticasone (FLONASE) 50 MCG/ACT nasal spray Place 2 sprays into both nostrils daily. 16 g 0   hydrOXYzine (ATARAX/VISTARIL) 25 MG tablet Take 1 tablet (25 mg total) by mouth 3 (three) times daily as needed for anxiety. May take 1 extra dose for max 50mg  at one time if needed. 30 tablet 0   lisinopril (ZESTRIL) 20 MG tablet Take 1 tablet (20 mg total) by mouth daily. 90 tablet 1   No current facility-administered medications for this visit.    Allergies  Allergen Reactions   Amoxicillin-Pot Clavulanate Rash   Tetracycline Rash    Family History  Problem Relation Age of Onset   Alcohol abuse Mother    Drug abuse Mother    Cancer Mother        uterine   Alcohol abuse Father    Diabetes Father    Stroke Maternal Grandmother    Kidney disease Maternal Grandmother    Breast cancer Neg Hx     Social History   Socioeconomic History   Marital status:  Divorced    Spouse name: Not on file   Number of children: Not on file   Years of education: Not on file   Highest education level: Not on file  Occupational History   Not on file  Tobacco Use   Smoking status: Former    Types: Cigarettes   Smokeless tobacco: Never   Tobacco comments:    quit x 15 years ago  Vaping Use   Vaping Use: Not on file  Substance and Sexual Activity   Alcohol use: Yes    Alcohol/week: 0.0 standard drinks    Comment: social   Drug use: Yes    Types: Cocaine   Sexual activity: Not Currently  Other Topics Concern   Not on file  Social History Narrative   Not on file   Social Determinants of Health   Financial Resource Strain: Not on file  Food Insecurity: Not on file  Transportation Needs: Not on file  Physical Activity: Not on file  Stress: Not on file  Social Connections: Not on file  Intimate Partner Violence: Not on file     Constitutional: Denies fever, malaise, fatigue, headache or abrupt weight changes.  HEENT: Denies eye pain, eye redness, ear pain, ringing in the ears, wax buildup, runny nose, nasal congestion,  bloody nose, or sore throat. Respiratory: Denies difficulty breathing, shortness of breath, cough or sputum production.   Cardiovascular: Denies chest pain, chest tightness, palpitations or swelling in the hands or feet.  Gastrointestinal: Pt reports food getting caught in esophagus. Denies abdominal pain, bloating, constipation, diarrhea or blood in the stool.  GU: Denies urgency, frequency, pain with urination, burning sensation, blood in urine, odor or discharge. Musculoskeletal: Pt reports left shoulder pain (s/p surgery). Denies decrease in range of motion, difficulty with gait, muscle pain or joint swelling.  Skin: Denies redness, rashes, lesions or ulcercations.  Neurological: Denies dizziness, difficulty with memory, difficulty with speech or problems with balance and coordination.  Psych: Pt has a history of anxiety and  depression. Denies SI/HI.  No other specific complaints in a complete review of systems (except as listed in HPI above).     Objective:   Physical Exam  BP 134/80 (BP Location: Right Arm, Patient Position: Sitting, Cuff Size: Normal)    Pulse (!) 51    Temp (!) 97.1 F (36.2 C) (Temporal)    Resp 17    Ht 5' 6.5" (1.689 m)    Wt 207 lb 12.8 oz (94.3 kg)    LMP 06/30/2018    SpO2 99%    BMI 33.04 kg/m   Wt Readings from Last 3 Encounters:  12/11/20 212 lb (96.2 kg)  09/24/19 216 lb (98 kg)  02/01/19 210 lb (95.3 kg)    General: Appears her stated age, obese, in NAD. Skin: Warm, dry and intact.  HEENT: Head: normal shape and size; Eyes: EOMs intact;  Neck:  Neck supple, trachea midline. No masses, lumps or thyromegaly present.  Cardiovascular: Bradycardic with normal rhythm. S1,S2 noted.  No murmur, rubs or gallops noted. No JVD or BLE edema. No carotid bruits noted. Pulmonary/Chest: Normal effort and positive vesicular breath sounds. No respiratory distress. No wheezes, rales or ronchi noted.  Abdomen: Normal bowel sounds.  Musculoskeletal: Left arm in sling. Strength 5/5 BLE. No difficulty with gait.  Neurological: Alert and oriented. Cranial nerves II-XII grossly intact. Coordination normal.  Psychiatric: Mood and affect normal. Behavior is normal. Judgment and thought content normal.    BMET    Component Value Date/Time   NA 137 09/24/2019 1511   K 3.8 09/24/2019 1511   CL 103 09/24/2019 1511   CO2 26 09/24/2019 1511   GLUCOSE 79 09/24/2019 1511   BUN 12 09/24/2019 1511   CREATININE 0.63 09/24/2019 1511   CALCIUM 8.9 09/24/2019 1511    Lipid Panel     Component Value Date/Time   CHOL 195 09/24/2019 1511   TRIG 223.0 (H) 09/24/2019 1511   HDL 50.60 09/24/2019 1511   CHOLHDL 4 09/24/2019 1511   VLDL 44.6 (H) 09/24/2019 1511   LDLCALC 75 06/02/2017 1232    CBC    Component Value Date/Time   WBC 7.1 09/24/2019 1511   RBC 3.95 09/24/2019 1511   HGB 11.5 (L)  09/24/2019 1511   HCT 34.6 (L) 09/24/2019 1511   PLT 240.0 09/24/2019 1511   MCV 87.6 09/24/2019 1511   MCHC 33.1 09/24/2019 1511   RDW 14.1 09/24/2019 1511    Hgb A1C Lab Results  Component Value Date   HGBA1C 5.7 09/24/2019           Assessment & Plan:   Preventative Health Maintenance:  Flu shot  UTD Tetanus UTD Encouraged her to get her covid booster Discussed Shingrix vaccine, she will check coverage with her insurance company Pap  smear UTD Mammogram UTD Colon screening UTD Encouraged her to consume a balanced diet and exercise regimen Advised her to see an eye doctor and dentist annually Will check CBC, CMET, Lipid, A1C, HIV and Hep C today  RTC in 6 months, follow up chronic conditions Nicki Reaper, NP This visit occurred during the SARS-CoV-2 public health emergency.  Safety protocols were in place, including screening questions prior to the visit, additional usage of staff PPE, and extensive cleaning of exam room while observing appropriate contact time as indicated for disinfecting solutions.

## 2021-07-02 NOTE — Telephone Encounter (Signed)
Requested medication (s) are due for refill today  Yes  Requested medication (s) are on the active medication list Yes  Future visit scheduled Yes for today at 1:20p  Notice to clinic-Most recent labs 09/24/19. Lab protocol not met. Routing to provider for review.    Requested Prescriptions  Pending Prescriptions Disp Refills   lisinopril (ZESTRIL) 20 MG tablet [Pharmacy Med Name: LISINOPRIL 20 MG TABLET] 30 tablet 5    Sig: TAKE 1 TABLET BY MOUTH EVERY DAY     Cardiovascular:  ACE Inhibitors Failed - 07/02/2021  1:30 AM      Failed - Cr in normal range and within 180 days    Creatinine, Ser  Date Value Ref Range Status  09/24/2019 0.63 0.40 - 1.20 mg/dL Final          Failed - K in normal range and within 180 days    Potassium  Date Value Ref Range Status  09/24/2019 3.8 3.5 - 5.1 mEq/L Final          Failed - Valid encounter within last 6 months    Recent Outpatient Visits           6 months ago Primary hypertension   [place], NP       Future Appointments             Today Jearld Fenton, NP [facility] [place] - Patient is not pregnant      Passed - Last BP in normal range    BP Readings from Last 1 Encounters:  12/11/20 124/76

## 2021-07-02 NOTE — Assessment & Plan Note (Signed)
Will trial Omeprazole 20 mg daily If worsens, will need referral to GI for upper GI

## 2021-07-03 LAB — COMPLETE METABOLIC PANEL WITH GFR
AG Ratio: 1.7 (calc) (ref 1.0–2.5)
ALT: 15 U/L (ref 6–29)
AST: 13 U/L (ref 10–35)
Albumin: 4.3 g/dL (ref 3.6–5.1)
Alkaline phosphatase (APISO): 80 U/L (ref 37–153)
BUN: 12 mg/dL (ref 7–25)
CO2: 24 mmol/L (ref 20–32)
Calcium: 9.4 mg/dL (ref 8.6–10.4)
Chloride: 107 mmol/L (ref 98–110)
Creat: 0.69 mg/dL (ref 0.50–1.03)
Globulin: 2.5 g/dL (calc) (ref 1.9–3.7)
Glucose, Bld: 85 mg/dL (ref 65–99)
Potassium: 4.2 mmol/L (ref 3.5–5.3)
Sodium: 139 mmol/L (ref 135–146)
Total Bilirubin: 0.6 mg/dL (ref 0.2–1.2)
Total Protein: 6.8 g/dL (ref 6.1–8.1)
eGFR: 104 mL/min/{1.73_m2} (ref >=60)

## 2021-07-03 LAB — CBC
HCT: 33 % — ABNORMAL LOW (ref 35.0–45.0)
Hemoglobin: 10.8 g/dL — ABNORMAL LOW (ref 11.7–15.5)
MCH: 28.9 pg (ref 27.0–33.0)
MCHC: 32.7 g/dL (ref 32.0–36.0)
MCV: 88.2 fL (ref 80.0–100.0)
MPV: 9.7 fL (ref 7.5–12.5)
Platelets: 262 10*3/uL (ref 140–400)
RBC: 3.74 10*6/uL — ABNORMAL LOW (ref 3.80–5.10)
RDW: 13.2 % (ref 11.0–15.0)
WBC: 5.5 10*3/uL (ref 3.8–10.8)

## 2021-07-03 LAB — HEPATITIS C ANTIBODY
Hepatitis C Ab: NONREACTIVE
SIGNAL TO CUT-OFF: 0.02 (ref <1.00)

## 2021-07-03 LAB — HEMOGLOBIN A1C
Hgb A1c MFr Bld: 5.4 % of total Hgb (ref <5.7)
Mean Plasma Glucose: 108 mg/dL
eAG (mmol/L): 6 mmol/L

## 2021-07-03 LAB — LIPID PANEL
Cholesterol: 207 mg/dL — ABNORMAL HIGH (ref <200)
HDL: 76 mg/dL (ref >=50)
LDL Cholesterol (Calc): 108 mg/dL (calc) — ABNORMAL HIGH
Non-HDL Cholesterol (Calc): 131 mg/dL (calc) — ABNORMAL HIGH (ref <130)
Total CHOL/HDL Ratio: 2.7 (calc) (ref <5.0)
Triglycerides: 121 mg/dL (ref <150)

## 2021-07-03 LAB — HIV ANTIBODY (ROUTINE TESTING W REFLEX): HIV 1&2 Ab, 4th Generation: NONREACTIVE

## 2021-07-24 ENCOUNTER — Other Ambulatory Visit: Payer: Self-pay | Admitting: Internal Medicine

## 2021-07-24 DIAGNOSIS — F419 Anxiety disorder, unspecified: Secondary | ICD-10-CM

## 2021-07-24 NOTE — Telephone Encounter (Signed)
Requested Prescriptions  Pending Prescriptions Disp Refills   hydrOXYzine (ATARAX) 25 MG tablet [Pharmacy Med Name: HYDROXYZINE HCL 25 MG TABLET] 30 tablet 0    Sig: 1 TABLET BY MOUTH 3X A DAY AS NEEDED FOR ANXIETY, MAY TAKE EXTRA DOSE ONCE IF NEEDED, MAX 50MG  DOSE     Ear, Nose, and Throat:  Antihistamines Passed - 07/24/2021  8:51 AM      Passed - Valid encounter within last 12 months    Recent Outpatient Visits          3 weeks ago Encounter for general adult medical examination with abnormal findings   [facility] [place], [place], NP   7 months ago Primary hypertension   [facility] [place], [place], NP

## 2021-08-04 ENCOUNTER — Ambulatory Visit (INDEPENDENT_AMBULATORY_CARE_PROVIDER_SITE_OTHER): Payer: 59 | Admitting: Psychology

## 2021-08-04 DIAGNOSIS — F331 Major depressive disorder, recurrent, moderate: Secondary | ICD-10-CM | POA: Diagnosis not present

## 2021-08-04 NOTE — Progress Notes (Signed)
[facility] Counselor/Therapist Progress Note  Patient ID: Melissa Cowan, MRN: [id number],    Date: 08/04/2021  Time Spent: 50 minutes  Treatment Type: Individual Therapy  Reported Symptoms: depression and anxiety  Mental Status Exam: Appearance:  Casual     Behavior: Appropriate  Motor: Normal  Speech/Language:  Normal Rate  Affect: Appropriate  Mood: normal  Thought process: normal  Thought content:   WNL  Sensory/Perceptual disturbances:   WNL  Orientation: oriented to person, place, time/date, and situation  Attention: Good  Concentration: Good  Memory: WNL  Fund of knowledge:  Good  Insight:   Good  Judgment:  Good  Impulse Control: Good   Risk Assessment: Danger to Self:  No Self-injurious Behavior: No Danger to Others: No Duty to Warn:no Physical Aggression / Violence:No  Access to Firearms a concern: No  Gang Involvement:No   Subjective: The patient attended a face-to-face individual therapy session via video visit today.  The patient gave verbal consent for the video visit to be on WebEx.  The patient was in her home alone and the therapist was in her office.  The patient reports that she broke up with her boyfriend over the weekend.  She states that she caught him at a bar talking to 2 women and he did not acknowledge her.  She has not seemed to be very invested in this relationship in the last 3 years and he has not actually been very supportive of her.  She seems to have already known that it may not work out.  I encouraged her to take care of herself and to make sure that she does not take him back if she feels like he is going to go back to the old behavior.  The patient requested an appointment in 1 month and will call if necessary.  Interventions: Cognitive Behavioral Therapy  Diagnosis:Major depressive disorder, recurrent episode, moderate (HCC)  Plan: Treatment Plan  Strengths/Abilities:  Intelligent, caring, ability for insight,  motivated   Treatment Preferences:  Outpatient Individual therapy  Statement of Needs:  " I need help with depression and anxiety"  Symptoms:  Depressed or irritable mood.: (Status: improved). Diminished interest in or  enjoyment of activities.: (Status: improved). History of chronic or recurrent  depression for which the client has taken antidepressant medication, been hospitalized, had outpatient  treatment, or had a course of electroconvulsive therapy.: (Status: maintained).  Lack of energy.: (Status: improved). Motor tension (e.g., restlessness,  tiredness, shakiness, muscle tension).: (Status: improved). Poor concentration  and indecisiveness.: (Status: improved). Social withdrawal.:  (Status: maintained).   Problems Addressed: Unipolar depression and anxiety  Goals: 1. Alleviate depressive symptoms and return to previous level of effective  Functioning Target Date: 2022-03-20 Frequency: Monthly Progress: 40 Modality: individual 2. Appropriately grieve the loss in order to normalize mood and to return  to previously adaptive level of functioning Target Date: 2022-03-20 Frequency: Monthly Progress: 70 Modality: individual 3. Develop healthy interpersonal relationships that lead to the alleviation  and help prevent the relapse of depression. 4. Develop healthy thinking patterns and beliefs about self, others, and the world that lead to the alleviation and help prevent the relapse of  depression. 5. Recognize, accept, and cope with feelings of depression. 6. Stabilize anxiety level while increasing ability to function on a daily  basis. Objective Learn and implement calming skills to reduce overall anxiety and manage anxiety symptoms. Target Date: 2022-03-20 Frequency: Monthly Progress: 30 Modality: individual    Interventions by Therapist:  CBT, insight oriented  approach, grief counseling,  problem solving.  The patient is making progress and has approved this plan.  Caide Campi  G Aarin Bluett, LCSW                  Abed Schar G Alexi Geibel, LCSW

## 2021-08-29 ENCOUNTER — Other Ambulatory Visit: Payer: Self-pay | Admitting: Internal Medicine

## 2021-08-29 DIAGNOSIS — F419 Anxiety disorder, unspecified: Secondary | ICD-10-CM

## 2021-08-31 NOTE — Telephone Encounter (Signed)
Requested Prescriptions  ?Pending Prescriptions Disp Refills  ?? hydrOXYzine (ATARAX) 25 MG tablet [Pharmacy Med Name: HYDROXYZINE HCL 25 MG TABLET] 90 tablet 1  ?  Sig: 1 TABLET BY MOUTH 3X A DAY AS NEEDED FOR ANXIETY, MAY TAKE EXTRA DOSE ONCE IF NEEDED, MAX 50MG  DOSE  ?  ? Ear, Nose, and Throat:  Antihistamines 2 Passed - 08/29/2021 10:58 AM  ?  ?  Passed - Cr in normal range and within 360 days  ?  Creat  ?Date Value Ref Range Status  ?07/02/2021 0.69 0.50 - 1.03 mg/dL Final  ?   ?  ?  Passed - Valid encounter within last 12 months  ?  Recent Outpatient Visits   ?      ? 2 months ago Encounter for general adult medical examination with abnormal findings  ? [facility] [place], [place] W, NP  ? 8 months ago Primary hypertension  ? [facility] [place], Coralie Keens, NP  ?  ?  ? ?  ?  ?  ? ?

## 2021-09-02 ENCOUNTER — Ambulatory Visit (INDEPENDENT_AMBULATORY_CARE_PROVIDER_SITE_OTHER): Payer: 59 | Admitting: Psychology

## 2021-09-02 DIAGNOSIS — F331 Major depressive disorder, recurrent, moderate: Secondary | ICD-10-CM

## 2021-09-02 NOTE — Progress Notes (Signed)
[facility] Counselor/Therapist Progress Note ? ?Patient ID: Melissa Cowan, MRN: [id number],   ? ?Date: 09/02/2021 ? ?Time Spent: 60 minutes ? ?Treatment Type: Individual Therapy ? ?Reported Symptoms: depression and anxiety ? ?Mental Status Exam: ?Appearance:  Casual     ?Behavior: Appropriate  ?Motor: Normal  ?Speech/Language:  Normal Rate  ?Affect: Appropriate  ?Mood: normal  ?Thought process: normal  ?Thought content:   WNL  ?Sensory/Perceptual disturbances:   WNL  ?Orientation: oriented to person, place, time/date, and situation  ?Attention: Good  ?Concentration: Good  ?Memory: WNL  ?Fund of knowledge:  Good  ?Insight:   Good  ?Judgment:  Good  ?Impulse Control: Good  ? ?Risk Assessment: ?Danger to Self:  No ?Self-injurious Behavior: No ?Danger to Others: No ?Duty to Warn:no ?Physical Aggression / Violence:No  ?Access to Firearms a concern: No  ?Gang Involvement:No  ? ?Subjective: The patient attended a face-to-face individual therapy session via video visit today.  The patient gave verbal consent for the video visit to be on WebEx.  The patient was in her home alone and the therapist was in her office.  The patient reports that she is back with her boyfriend to some degree.  We talked about how to manage her feelings around this situation.  The patient reports that she plans to stay distant because she realizes that he is not going to change and be able to give her the relationship that she wants.  Gave patient validation for how she is handling the situation.  The patient seems to be doing well with her depression and also her grief about her Father.  ? ?Interventions: Cognitive Behavioral Therapy ? ?Diagnosis:Major depressive disorder, recurrent episode, moderate ([place]) ? ?Plan: Treatment Plan ? ?Strengths/Abilities:  Intelligent, caring, ability for insight, motivated  ? ?Treatment Preferences:  Outpatient Individual therapy ? ?Statement of Needs:  " I need help with depression and  anxiety" ? ?Symptoms:  Depressed or irritable mood.: (Status: improved). Diminished interest in or  ?enjoyment of activities.: (Status: improved). History of chronic or recurrent  ?depression for which the client has taken antidepressant medication, been hospitalized, had outpatient  ?treatment, or had a course of electroconvulsive therapy.: (Status: maintained).  ?Lack of energy.: (Status: improved). Motor tension (e.g., restlessness,  ?tiredness, shakiness, muscle tension).: (Status: improved). Poor concentration  ?and indecisiveness.: (Status: improved). Social withdrawal.:  (Status: maintained).  ? ?Problems Addressed: Unipolar depression and anxiety ? ?Goals: 1. Alleviate depressive symptoms and return to previous level of effective  ?Functioning ?Target Date: 2022-03-20 Frequency: Monthly ?Progress: 40 Modality: individual ?2. Appropriately grieve the loss in order to normalize mood and to return  ?to previously adaptive level of functioning ?Target Date: 2022-03-20 Frequency: Monthly ?Progress: 70 Modality: individual ?3. Develop healthy interpersonal relationships that lead to the alleviation  ?and help prevent the relapse of depression. ?4. Develop healthy thinking patterns and beliefs about self, others, and the ?world that lead to the alleviation and help prevent the relapse of  ?depression. ?5. Recognize, accept, and cope with feelings of depression. ?6. Stabilize anxiety level while increasing ability to function on a daily  ?basis. ?Objective ?Learn and implement calming skills to reduce overall anxiety and manage anxiety symptoms. ?Target Date: 2022-03-20 Frequency: Monthly ?Progress: 30 Modality: individual ? ? ? ?Interventions by Therapist:  CBT, insight oriented approach, grief counseling,  problem solving.  The patient is making progress and has approved this plan. ? ?Bodi Palmeri G Analena Gama, LCSW ? ? ? ? ? ? ? ? ? ? ? ? ? ? ? ? ? ?  Avayah Raffety G Jodene Polyak, LCSW ? ? ? ? ? ? ? ? ? ? ? ? ? ? ?Cristal Qadir G Jyasia Markoff, LCSW ?

## 2021-10-06 ENCOUNTER — Ambulatory Visit (INDEPENDENT_AMBULATORY_CARE_PROVIDER_SITE_OTHER): Payer: 59 | Admitting: Psychology

## 2021-10-06 DIAGNOSIS — F331 Major depressive disorder, recurrent, moderate: Secondary | ICD-10-CM | POA: Diagnosis not present

## 2021-10-07 NOTE — Progress Notes (Signed)
[facility] Counselor/Therapist Progress Note ? ?Patient ID: Niemah Schwebke, MRN: [id number],   ? ?Date: 10/06/2021 ? ?Time Spent: 60 minutes ? ?Treatment Type: Individual Therapy ? ?Reported Symptoms: depression and anxiety ? ?Mental Status Exam: ?Appearance:  Casual     ?Behavior: Appropriate  ?Motor: Normal  ?Speech/Language:  Normal Rate  ?Affect: Appropriate  ?Mood: normal  ?Thought process: normal  ?Thought content:   WNL  ?Sensory/Perceptual disturbances:   WNL  ?Orientation: oriented to person, place, time/date, and situation  ?Attention: Good  ?Concentration: Good  ?Memory: WNL  ?Fund of knowledge:  Good  ?Insight:   Good  ?Judgment:  Good  ?Impulse Control: Good  ? ?Risk Assessment: ?Danger to Self:  No ?Self-injurious Behavior: No ?Danger to Others: No ?Duty to Warn:no ?Physical Aggression / Violence:No  ?Access to Firearms a concern: No  ?Gang Involvement:No  ? ?Subjective: The patient attended a face-to-face individual therapy session via video visit today.  The patient gave verbal consent for the video visit to be on WebEx.  The patient was in her home alone and the therapist was in her office.  The patient presents with a blunted affect and mood is somewhat depressed.  The patient reports that she has had a couple of days where she struggled with her sadness.  She has nothing in particular that she feels like it is causing it however she is still grieving her father's death.  The patient also reports that her sister is having some issues with her niece and that is concerning for her.  I recommended that she try to stay out of that situation since it is her sister's issues.  Encouraged the patient to continue to find things that make her happy and take care of herself. ? ?Interventions: Cognitive Behavioral Therapy ? ?Diagnosis:Major depressive disorder, recurrent episode, moderate (HCC) ? ?Plan: Treatment Plan ? ?Strengths/Abilities:  Intelligent, caring, ability for insight, motivated   ? ?Treatment Preferences:  Outpatient Individual therapy ? ?Statement of Needs:  " I need help with depression and anxiety" ? ?Symptoms:  Depressed or irritable mood.: (Status: improved). Diminished interest in or  ?enjoyment of activities.: (Status: improved). History of chronic or recurrent  ?depression for which the client has taken antidepressant medication, been hospitalized, had outpatient  ?treatment, or had a course of electroconvulsive therapy.: (Status: maintained).  ?Lack of energy.: (Status: improved). Motor tension (e.g., restlessness,  ?tiredness, shakiness, muscle tension).: (Status: improved). Poor concentration  ?and indecisiveness.: (Status: improved). Social withdrawal.:  (Status: maintained).  ? ?Problems Addressed: Unipolar depression and anxiety ? ?Goals: 1. Alleviate depressive symptoms and return to previous level of effective  ?Functioning ?Target Date: 2022-03-20 Frequency: Monthly ?Progress: 40 Modality: individual ?2. Appropriately grieve the loss in order to normalize mood and to return  ?to previously adaptive level of functioning ?Target Date: 2022-03-20 Frequency: Monthly ?Progress: 70 Modality: individual ?3. Develop healthy interpersonal relationships that lead to the alleviation  ?and help prevent the relapse of depression. ?4. Develop healthy thinking patterns and beliefs about self, others, and the ?world that lead to the alleviation and help prevent the relapse of  ?depression. ?5. Recognize, accept, and cope with feelings of depression. ?6. Stabilize anxiety level while increasing ability to function on a daily  ?basis. ?Objective ?Learn and implement calming skills to reduce overall anxiety and manage anxiety symptoms. ?Target Date: 2022-03-20 Frequency: Monthly ?Progress: 30 Modality: individual ? ? ? ?Interventions by Therapist:  CBT, insight oriented approach, grief counseling,  problem solving.  The patient is making progress  and has approved this plan. ? ?Destaney Sarkis G Christofer Shen,  LCSW ? ? ? ? ? ? ? ? ? ? ? ? ? ? ? ? ? ?Deseree Zemaitis G Vitali Seibert, LCSW ? ? ? ? ? ? ? ? ? ? ? ? ? ? ?Aaralyn Kil G Earmon Sherrow, LCSW ? ? ? ? ? ? ? ? ? ? ? ? ? ? ?Greogry Goodwyn G Tonilynn Bieker, LCSW ?

## 2021-10-17 ENCOUNTER — Other Ambulatory Visit: Payer: Self-pay | Admitting: Internal Medicine

## 2021-10-17 ENCOUNTER — Encounter: Payer: Self-pay | Admitting: Internal Medicine

## 2021-10-19 NOTE — Telephone Encounter (Signed)
Requested medications are due for refill today.  yes ? ?Requested medications are on the active medications list.  yes ? ?Last refill. 12/11/2020 #180 1 refill ? ?Future visit scheduled.   no ? ?Notes to clinic.  Per note from Pt.of 10/17/2021, she is taking 3 tablets daily please review. ? ? ? ?Requested Prescriptions  ?Pending Prescriptions Disp Refills  ? FLUoxetine (PROZAC) 20 MG capsule [Pharmacy Med Name: FLUOXETINE HCL 20 MG CAPSULE] 180 capsule 1  ?  Sig: TAKE 2 CAPSULES BY MOUTH EVERY DAY  ?  ? Psychiatry:  Antidepressants - SSRI Passed - 10/17/2021  1:00 PM  ?  ?  Passed - Completed PHQ-2 or PHQ-9 in the last 360 days  ?  ?  Passed - Valid encounter within last 6 months  ?  Recent Outpatient Visits   ? ?      ? 3 months ago Encounter for general adult medical examination with abnormal findings  ? [facility] [place], [place] W, NP  ? 10 months ago Primary hypertension  ? [facility] [place], [place], NP  ? ?  ?  ? ? ?  ?  ?  ?  ?

## 2021-11-04 ENCOUNTER — Other Ambulatory Visit: Payer: Self-pay | Admitting: Internal Medicine

## 2021-11-04 ENCOUNTER — Ambulatory Visit: Payer: 59 | Admitting: Psychology

## 2021-11-04 DIAGNOSIS — F32A Depression, unspecified: Secondary | ICD-10-CM

## 2021-11-04 NOTE — Telephone Encounter (Signed)
Requested Prescriptions  ?Pending Prescriptions Disp Refills  ?? hydrOXYzine (ATARAX) 25 MG tablet [Pharmacy Med Name: HYDROXYZINE HCL 25 MG TABLET] 90 tablet 1  ?  Sig: 1 TABLET BY MOUTH 3X A DAY AS NEEDED FOR ANXIETY, MAY TAKE EXTRA DOSE ONCE IF NEEDED, MAX 50MG  DOSE  ?  ? Ear, Nose, and Throat:  Antihistamines 2 Passed - 11/04/2021  2:43 AM  ?  ?  Passed - Cr in normal range and within 360 days  ?  Creat  ?Date Value Ref Range Status  ?07/02/2021 0.69 0.50 - 1.03 mg/dL Final  ?   ?  ?  Passed - Valid encounter within last 12 months  ?  Recent Outpatient Visits   ?      ? 4 months ago Encounter for general adult medical examination with abnormal findings  ? [facility] [place], [place] W, NP  ? 10 months ago Primary hypertension  ? [facility] [place], [place], NP  ?  ?  ? ?  ?  ?  ? ?

## 2021-12-01 ENCOUNTER — Ambulatory Visit (INDEPENDENT_AMBULATORY_CARE_PROVIDER_SITE_OTHER): Payer: 59 | Admitting: Psychology

## 2021-12-01 DIAGNOSIS — F331 Major depressive disorder, recurrent, moderate: Secondary | ICD-10-CM

## 2021-12-01 NOTE — Progress Notes (Signed)
[facility] Counselor/Therapist Progress Note  Patient ID: Melissa Cowan, MRN: [id number],    Date: 12/01/2021  Time Spent: 60 minutes  Treatment Type: Individual Therapy  Reported Symptoms: depression and anxiety  Mental Status Exam: Appearance:  Casual     Behavior: Appropriate  Motor: Normal  Speech/Language:  Normal Rate  Affect: depressed  Mood: depressed  Thought process: normal  Thought content:   WNL  Sensory/Perceptual disturbances:   WNL  Orientation: oriented to person, place, time/date, and situation  Attention: Good  Concentration: Good  Memory: WNL  Fund of knowledge:  Good  Insight:   Good  Judgment:  Good  Impulse Control: Good   Risk Assessment: Danger to Self:  No Self-injurious Behavior: No Danger to Others: No Duty to Warn:no Physical Aggression / Violence:No  Access to Firearms a concern: No  Gang Involvement:No   Subjective: The patient attended a face-to-face individual therapy session via video visit today.  The patient gave verbal consent for the video visit to be on WebEx.  The patient was in her home alone and the therapist was in her office.  The patient presents as sad and depressed.  The patient reports that she is homesick today and really not feeling well physically.  We talked about what is going on with her and it seems that her depression has gotten worse over the last few months.  The patient continues to take 60 mg of Prozac a day that she has been on for a number of years.  In addition she has been on Wellbutrin and that does not seem to have made a difference.  I recommended today that she contact [facility] to see if she can get an appointment with a nurse practitioner there in order to have her medicines evaluated as she has been on this medicine for quite some time.  We talked briefly about her relationship with her significant other and that this might not be for feeling her very well.  The patient has some  issues with feeling all alone because both of her parents are now deceased and her sister is in [place] and not very supportive.  The patient does not want to talk about the relationship issues at this point in time and it might be better when she is more stable on her medicines.  We scheduled an appointment for several weeks away but the patient is aware that she can contact me if she needs something sooner. Interventions: Cognitive Behavioral Therapy  Diagnosis:Major depressive disorder, recurrent episode, moderate (HCC)  Plan: Treatment Plan  Strengths/Abilities:  Intelligent, caring, ability for insight, motivated   Treatment Preferences:  Outpatient Individual therapy  Statement of Needs:  " I need help with depression and anxiety"  Symptoms:  Depressed or irritable mood.: (Status: improved). Diminished interest in or  enjoyment of activities.: (Status: improved). History of chronic or recurrent  depression for which the client has taken antidepressant medication, been hospitalized, had outpatient  treatment, or had a course of electroconvulsive therapy.: (Status: maintained).  Lack of energy.: (Status: improved). Motor tension (e.g., restlessness,  tiredness, shakiness, muscle tension).: (Status: improved). Poor concentration  and indecisiveness.: (Status: improved). Social withdrawal.:  (Status: maintained).   Problems Addressed: Unipolar depression and anxiety  Goals: 1. Alleviate depressive symptoms and return to previous level of effective  Functioning Target Date: 2022-03-20 Frequency: Monthly Progress: 40 Modality: individual 2. Appropriately grieve the loss in order to normalize mood and to return  to previously adaptive level of functioning Target  Date: 2022-03-20 Frequency: Monthly Progress: 19 Modality: individual 3. Develop healthy interpersonal relationships that lead to the alleviation  and help prevent the relapse of depression. 4. Develop healthy thinking  patterns and beliefs about self, others, and the world that lead to the alleviation and help prevent the relapse of  depression. 5. Recognize, accept, and cope with feelings of depression. 6. Stabilize anxiety level while increasing ability to function on a daily  basis. Objective Learn and implement calming skills to reduce overall anxiety and manage anxiety symptoms. Target Date: 2022-03-20 Frequency: Monthly Progress: 30 Modality: individual    Interventions by Therapist:  CBT, insight oriented approach, grief counseling,  problem solving.  The patient is making progress and has approved this plan.  Kramer Hanrahan G Durelle Zepeda, LCSW                  Eryck Negron G Raylan Troiani, LCSW               Zykiria Bruening G Margaret Cockerill, LCSW               Wahneta Derocher G Ahmon Tosi, LCSW               Cristofher Livecchi G Marella Vanderpol, LCSW

## 2021-12-03 ENCOUNTER — Other Ambulatory Visit: Payer: Self-pay | Admitting: Internal Medicine

## 2021-12-03 NOTE — Telephone Encounter (Signed)
Requested Prescriptions  Pending Prescriptions Disp Refills  . buPROPion (WELLBUTRIN XL) 150 MG 24 hr tablet [Pharmacy Med Name: BUPROPION HCL XL 150 MG TABLET] 30 tablet 0    Sig: TAKE 1 TABLET BY MOUTH EVERY DAY     Psychiatry: Antidepressants - bupropion Passed - 12/03/2021  3:17 AM      Passed - Cr in normal range and within 360 days    Creat  Date Value Ref Range Status  07/02/2021 0.69 0.50 - 1.03 mg/dL Final         Passed - AST in normal range and within 360 days    AST  Date Value Ref Range Status  07/02/2021 13 10 - 35 U/L Final         Passed - ALT in normal range and within 360 days    ALT  Date Value Ref Range Status  07/02/2021 15 6 - 29 U/L Final         Passed - Completed PHQ-2 or PHQ-9 in the last 360 days      Passed - Last BP in normal range    BP Readings from Last 1 Encounters:  07/02/21 134/80         Passed - Valid encounter within last 6 months    Recent Outpatient Visits          5 months ago Encounter for general adult medical examination with abnormal findings   [facility] [place], Coralie Keens, NP   11 months ago Primary hypertension   [facility] [place], Coralie Keens, NP

## 2022-01-01 ENCOUNTER — Ambulatory Visit: Payer: 59 | Admitting: Adult Health

## 2022-01-01 ENCOUNTER — Encounter: Payer: Self-pay | Admitting: Adult Health

## 2022-01-01 VITALS — BP 139/92 | HR 83 | Ht 68.0 in | Wt 206.0 lb

## 2022-01-01 DIAGNOSIS — F411 Generalized anxiety disorder: Secondary | ICD-10-CM

## 2022-01-01 DIAGNOSIS — F331 Major depressive disorder, recurrent, moderate: Secondary | ICD-10-CM

## 2022-01-01 MED ORDER — BUPROPION HCL ER (XL) 150 MG PO TB24: ORAL_TABLET | ORAL | 1 refills | Status: DC

## 2022-01-01 MED ORDER — FLUOXETINE HCL 20 MG PO CAPS: 60.0000 mg | ORAL_CAPSULE | Freq: Every day | ORAL | 0 refills | Status: DC

## 2022-01-01 NOTE — Progress Notes (Signed)
[facility] MD/PA/NP Initial Note  01/01/2022 2:37 PM Melissa Cowan  MRN:  [id number]  Chief Complaint:   HPI:  Patient seen today for initial psychiatric evaluation.  Referred by therapist - Bambi Cottle.  Describes mood today as "not so good". Pleasant. Tearful at times. Mood symptoms - reports depression, anxiety, and irritability. Reports worry and rumination - trying to look forward to the future - having flashbacks. History of obsessive behaviors - spending, likes to have some things in order. Reports brain fog - forgetting and difficulties recalling things. Reports procrastination. 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 Inability to finish projects. Gets distracted easily. Shuts down when someone says negative things about me. Feels like "what's the point". Has been on Prozac for several years and started on Wellbutrin 3 months ago without any noted benefit. Feels like she also struggles with the loss of both parents. Mother passed away 6 years ago in Jan 01, 2023, the year after on the same day her mother passed away, had to put her dog of 13 years down. Lost her father in August of 2022. Has been working with a therapist for the past 6 years - Chiropodist.Decreased interest and motivation. Taking medications as prescribed.  Energy levels lower. Active, does not have a regular exercise routine - has 2 gym memberships. Enjoys some usual interests and activities. Lives alone. Has a boyfriend. Sister in [place]. Parents deceased. Friends local and supportive. Appetite adequate. Weight stable - 206 pounds. Sleeps well most nights. Averages 6 hours. Considering consult for sleep apnea. Focus and concentration difficulties - more so as an adult. Completing tasks. 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  Denies AH or VH. Denies self harm. History of cutting - over a break up.  Denies substance use.  Previous medication trials:  Buspar,  Lexapro  Visit Diagnosis:    ICD-10-CM   1. Generalized anxiety disorder  F41.1 FLUoxetine (PROZAC) 20 MG capsule    buPROPion (WELLBUTRIN XL) 150 MG 24 hr tablet    2. Major depressive disorder, recurrent episode, moderate (HCC)  F33.1 buPROPion (WELLBUTRIN XL) 150 MG 24 hr tablet      Past Psychiatric History: Diagnosed with depression and anxiety - 1998 - started on Prozac. Denies psychiatric hospitalization.   Past Medical History:  Past Medical History:  Diagnosis Date   Allergy    Depression    Endometriosis determined by laparoscopy 1993   GERD (gastroesophageal reflux disease)    Hypertension     Past Surgical History:  Procedure Laterality Date   CARPAL TUNNEL RELEASE Bilateral    03/2019, 05/2019   COLONOSCOPY WITH PROPOFOL N/A 02/01/2019   Procedure: COLONOSCOPY WITH PROPOFOL;  Surgeon: Toney Reil, MD;  Location: [facility];  Service: Gastroenterology;  Laterality: N/A;   LAPAROSCOPIC OVARIAN CYSTECTOMY  1993    Family Psychiatric History: Reports a family history of mental illness.   Family History:  Family History  Problem Relation Age of Onset   Alcohol abuse Mother    Drug abuse Mother    Cancer Mother        uterine   Alcohol abuse Father    Diabetes Father    Stroke Maternal Grandmother    Kidney disease Maternal Grandmother    Breast cancer Neg Hx     Social History:  Social History   Socioeconomic History   Marital status: Divorced    Spouse name: Not on file   Number of children:  Not on file   Years of education: Not on file   Highest education level: Not on file  Occupational History   Not on file  Tobacco Use   Smoking status: Former    Types: Cigarettes   Smokeless tobacco: Never   Tobacco comments:    quit x 15 years ago  Vaping Use   Vaping Use: Not on file  Substance and Sexual Activity   Alcohol use: Yes    Alcohol/week: 0.0 standard drinks of alcohol    Comment: social   Drug use: Not Currently    Types:  Cocaine   Sexual activity: Not Currently  Other Topics Concern   Not on file  Social History Narrative   Not on file   Social Determinants of Health   Financial Resource Strain: Not on file  Food Insecurity: Not on file  Transportation Needs: Not on file  Physical Activity: Not on file  Stress: Not on file  Social Connections: Not on file    Allergies:  Allergies  Allergen Reactions   Amoxicillin    Amoxicillin-Pot Clavulanate Rash   Tetracycline Rash    Metabolic Disorder Labs: Lab Results  Component Value Date   HGBA1C 5.4 07/02/2021   MPG 108 07/02/2021   No results found for: "PROLACTIN" Lab Results  Component Value Date   CHOL 207 (H) 07/02/2021   TRIG 121 07/02/2021   HDL 76 07/02/2021   CHOLHDL 2.7 07/02/2021   VLDL 44.6 (H) 09/24/2019   LDLCALC 108 (H) 07/02/2021   LDLCALC 75 06/02/2017   Lab Results  Component Value Date   TSH 2.00 01/22/2015    Therapeutic Level Labs: No results found for: "LITHIUM" No results found for: "VALPROATE" No results found for: "CBMZ"  Current Medications: Current Outpatient Medications  Medication Sig Dispense Refill   buPROPion (WELLBUTRIN XL) 150 MG 24 hr tablet Take three tablets every morning. 90 tablet 1   Black Cohosh 200 MG CAPS Take by mouth.     Cholecalciferol (VITAMIN D3) 1.25 MG (50000 UT) TABS Take by mouth.     Cyanocobalamin 2000 MCG/ML SOLN Inject as directed.     FLUoxetine (PROZAC) 20 MG capsule Take 3 capsules (60 mg total) by mouth daily. 270 capsule 0   fluticasone (FLONASE) 50 MCG/ACT nasal spray Place 2 sprays into both nostrils daily. 16 g 0   HYDROcodone-acetaminophen (NORCO/VICODIN) 5-325 MG tablet Take 1-2 tablets by mouth every 4 (four) hours as needed.     hydrOXYzine (ATARAX) 25 MG tablet 1 TABLET BY MOUTH 3X A DAY AS NEEDED FOR ANXIETY, MAY TAKE EXTRA DOSE ONCE IF NEEDED, MAX 50MG  DOSE 90 tablet 1   ibuprofen (ADVIL) 800 MG tablet Take 800 mg by mouth 3 (three) times daily.      lisinopril (ZESTRIL) 20 MG tablet TAKE 1 TABLET BY MOUTH EVERY DAY 90 tablet 1   MAGNESIUM CITRATE PO Take by mouth.     omeprazole (PRILOSEC) 20 MG capsule Take 1 capsule (20 mg total) by mouth daily. 90 capsule 1   No current facility-administered medications for this visit.    Medication Side Effects: none  Orders placed this visit:  No orders of the defined types were placed in this encounter.   Psychiatric Specialty Exam:  Review of Systems  Musculoskeletal:  Negative for gait problem.  Neurological:  Negative for tremors.  Psychiatric/Behavioral:         Please refer to HPI    Blood pressure (!) 139/92, pulse 83, height 5\' 8"  (  1.727 m), weight 206 lb (93.4 kg), last menstrual period 06/28/2018.Body mass index is 31.32 kg/m.  General Appearance: Casual and Neat  Eye Contact:  Good  Speech:  Clear and Coherent and Normal Rate  Volume:  Normal  Mood:  Depressed  Affect:  Appropriate and Congruent  Thought Process:  Coherent and Descriptions of Associations: Intact  Orientation:  Full (Time, Place, and Person)  Thought Content: Logical   Suicidal Thoughts:  No  Homicidal Thoughts:  No  Memory:  WNL  Judgement:  Good  Insight:  Good  Psychomotor Activity:  Normal  Concentration:  Concentration: difficulties  and Attention Span: difficulties  Recall:  Good  Fund of Knowledge: Good  Language: Good  Assets:  Communication Skills Desire for Improvement Financial Resources/Insurance Housing Intimacy Leisure Time Physical Health Resilience Social Support Talents/Skills Transportation Vocational/Educational  ADL's:  Intact  Cognition: WNL  Prognosis:  Good   Screenings:  GAD-7    Flowsheet Row Office Visit from 12/11/2020 in [facility] Office Visit from 09/24/2019 in [facility]  Total GAD-7 Score 2 0      PHQ2-9    Flowsheet Row Office Visit from 07/02/2021 in [facility] Office Visit from 12/11/2020 in  [facility] Office Visit from 09/24/2019 in [facility] Visit from 09/06/2018 in [facility] Visit from 11/01/2017 in [facility]  PHQ-2 Total Score 1 4 0 0 0  PHQ-9 Total Score -- 15 0 0 --       Receiving Psychotherapy: Yes   Treatment Plan/Recommendations:  Plan:  PDMP reviewed  Continue Proazc 40mg  daily Increase Wellbutrin XL 150mg  every morning to 300mg  daily for 2 weeks, then increase to 450mg  every morning - denies seizure history.   Time spent with patient was 60 minutes. Greater than 50% of face to face time with patient was spent on counseling and coordination of care.    RTC 4 weeks  Patient advised to contact office with any questions, adverse effects, or acute worsening in signs and symptoms.      , NP

## 2022-01-02 ENCOUNTER — Other Ambulatory Visit: Payer: Self-pay | Admitting: Internal Medicine

## 2022-01-04 NOTE — Telephone Encounter (Signed)
Requested medication (s) are due for refill today:yes  Requested medication (s) are on the active medication list:yes}  Last refill:  07/02/21 #90 1 RF  Future visit scheduled: yes  Notes to clinic:  overdue lab work    Requested Prescriptions  Pending Prescriptions Disp Refills   lisinopril (ZESTRIL) 20 MG tablet [Pharmacy Med Name: LISINOPRIL 20 MG TABLET] 30 tablet 5    Sig: TAKE 1 TABLET BY MOUTH EVERY DAY     Cardiovascular:  ACE Inhibitors Failed - 01/02/2022 10:58 AM      Failed - Cr in normal range and within 180 days    Creat  Date Value Ref Range Status  07/02/2021 0.69 0.50 - 1.03 mg/dL Final         Failed - K in normal range and within 180 days    Potassium  Date Value Ref Range Status  07/02/2021 4.2 3.5 - 5.3 mmol/L Final         Failed - Last BP in normal range    BP Readings from Last 1 Encounters:  07/02/21 134/80         Failed - Valid encounter within last 6 months    Recent Outpatient Visits           6 months ago Encounter for general adult medical examination with abnormal findings   [facility], Salvadore Oxford, NP   1 year ago Primary hypertension   [facility] [place], Salvadore Oxford, NP       Future Appointments             In 2 weeks Sampson Si, Salvadore Oxford, NP [facility], [facility]            Passed - Patient is not pregnant

## 2022-01-12 ENCOUNTER — Ambulatory Visit: Payer: 59 | Admitting: Psychology

## 2022-01-19 ENCOUNTER — Ambulatory Visit (INDEPENDENT_AMBULATORY_CARE_PROVIDER_SITE_OTHER): Payer: 59 | Admitting: Internal Medicine

## 2022-01-19 ENCOUNTER — Encounter: Payer: Self-pay | Admitting: Internal Medicine

## 2022-01-19 VITALS — BP 136/84 | HR 79 | Temp 97.7°F | Wt 205.0 lb

## 2022-01-19 DIAGNOSIS — F32A Depression, unspecified: Secondary | ICD-10-CM

## 2022-01-19 DIAGNOSIS — F419 Anxiety disorder, unspecified: Secondary | ICD-10-CM

## 2022-01-19 DIAGNOSIS — Z1231 Encounter for screening mammogram for malignant neoplasm of breast: Secondary | ICD-10-CM | POA: Diagnosis not present

## 2022-01-19 DIAGNOSIS — I10 Essential (primary) hypertension: Secondary | ICD-10-CM

## 2022-01-19 DIAGNOSIS — K219 Gastro-esophageal reflux disease without esophagitis: Secondary | ICD-10-CM

## 2022-01-19 DIAGNOSIS — M170 Bilateral primary osteoarthritis of knee: Secondary | ICD-10-CM

## 2022-01-19 DIAGNOSIS — E7800 Pure hypercholesterolemia, unspecified: Secondary | ICD-10-CM

## 2022-01-19 DIAGNOSIS — D500 Iron deficiency anemia secondary to blood loss (chronic): Secondary | ICD-10-CM | POA: Diagnosis not present

## 2022-01-19 DIAGNOSIS — E6609 Other obesity due to excess calories: Secondary | ICD-10-CM

## 2022-01-19 DIAGNOSIS — E785 Hyperlipidemia, unspecified: Secondary | ICD-10-CM | POA: Insufficient documentation

## 2022-01-19 DIAGNOSIS — Z6831 Body mass index (BMI) 31.0-31.9, adult: Secondary | ICD-10-CM

## 2022-01-19 DIAGNOSIS — B002 Herpesviral gingivostomatitis and pharyngotonsillitis: Secondary | ICD-10-CM

## 2022-01-19 DIAGNOSIS — R7303 Prediabetes: Secondary | ICD-10-CM | POA: Diagnosis not present

## 2022-01-19 MED ORDER — OMEPRAZOLE 20 MG PO CPDR
20.0000 mg | DELAYED_RELEASE_CAPSULE | Freq: Every day | ORAL | 1 refills | Status: DC
Start: 2022-01-19 — End: 2022-07-22

## 2022-01-19 MED ORDER — LISINOPRIL 20 MG PO TABS
20.0000 mg | ORAL_TABLET | Freq: Every day | ORAL | 1 refills | Status: DC
Start: 2022-01-19 — End: 2022-10-14

## 2022-01-19 NOTE — Assessment & Plan Note (Signed)
CBC today.  

## 2022-01-19 NOTE — Progress Notes (Signed)
Subjective:    Patient ID: Melissa Cowan, female    DOB: Oct 28, 1965, 56 y.o.   MRN: [id number]  HPI  Patient presents to clinic today for follow-up of chronic conditions.  HTN: Her BP today is 136/84.  She is taking Lisinopril as prescribed.  ECG from 12/2014 reviewed.  Cold Sores: Intermittent.  She is not taking any antiviral medication at this time.  GERD: Triggered by caffeine, tomato-based foods.  She takes Omeprazole as needed.  There is no upper GI on file.  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  She is not currently taking any medications for this.  She does not follow with orthopedics.  Anxiety and Depression: Chronic, managed on Fluoxetine, Bupropion and Hydroxyzine.  She is seeing a therapist and a psychiatrist.  She denies SI/HI.  HLD: Her last LDL was 108, triglycerides 121, 06/2021.  She is not taking any cholesterol-lowering medication at this time.  She tries to consume low-fat diet.  Prediabetes: Her last A1c was 5.4%, 06/2021.  She is not taking any oral diabetic medication at this time.  She does not check her sugars.  Anemia: Her last H/H was 10.3/33, 06/2021.  She is not taking any oral iron at this time.  She does not follow with hematology.  Review of Systems     Past Medical History:  Diagnosis Date   Allergy    Depression    Endometriosis determined by laparoscopy 1993   GERD (gastroesophageal reflux disease)    Hypertension     Current Outpatient Medications  Medication Sig Dispense Refill   Black Cohosh 200 MG CAPS Take by mouth.     buPROPion (WELLBUTRIN XL) 150 MG 24 hr tablet Take three tablets every morning. 90 tablet 1   Cholecalciferol (VITAMIN D3) 1.25 MG (50000 UT) TABS Take by mouth.     Cyanocobalamin 2000 MCG/ML SOLN Inject as directed.     FLUoxetine (PROZAC) 20 MG capsule Take 3 capsules (60 mg total) by mouth daily. 270 capsule 0   fluticasone (FLONASE) 50 MCG/ACT nasal spray Place 2 sprays into both nostrils daily. 16 g 0    HYDROcodone-acetaminophen (NORCO/VICODIN) 5-325 MG tablet Take 1-2 tablets by mouth every 4 (four) hours as needed.     hydrOXYzine (ATARAX) 25 MG tablet 1 TABLET BY MOUTH 3X A DAY AS NEEDED FOR ANXIETY, MAY TAKE EXTRA DOSE ONCE IF NEEDED, MAX 50MG  DOSE 90 tablet 1   ibuprofen (ADVIL) 800 MG tablet Take 800 mg by mouth 3 (three) times daily.     lisinopril (ZESTRIL) 20 MG tablet TAKE 1 TABLET BY MOUTH EVERY DAY 90 tablet 0   MAGNESIUM CITRATE PO Take by mouth.     omeprazole (PRILOSEC) 20 MG capsule Take 1 capsule (20 mg total) by mouth daily. 90 capsule 1   No current facility-administered medications for this visit.    Allergies  Allergen Reactions   Amoxicillin    Amoxicillin-Pot Clavulanate Rash   Tetracycline Rash    Family History  Problem Relation Age of Onset   Alcohol abuse Mother    Drug abuse Mother    Cancer Mother        uterine   Alcohol abuse Father    Diabetes Father    Stroke Maternal Grandmother    Kidney disease Maternal Grandmother    Breast cancer Neg Hx     Social History   Socioeconomic History   Marital status: Divorced    Spouse name: Not on file   Number of children: Not  on file   Years of education: Not on file   Highest education level: Not on file  Occupational History   Not on file  Tobacco Use   Smoking status: Former    Types: Cigarettes   Smokeless tobacco: Never   Tobacco comments:    quit x 15 years ago  Vaping Use   Vaping Use: Not on file  Substance and Sexual Activity   Alcohol use: Yes    Alcohol/week: 0.0 standard drinks of alcohol    Comment: social   Drug use: Not Currently    Types: Cocaine   Sexual activity: Not Currently  Other Topics Concern   Not on file  Social History Narrative   Not on file   Social Determinants of Health   Financial Resource Strain: Not on file  Food Insecurity: Not on file  Transportation Needs: Not on file  Physical Activity: Not on file  Stress: Not on file  Social Connections:  Not on file  Intimate Partner Violence: Not on file     Constitutional: Denies fever, malaise, fatigue, headache or abrupt weight changes.  HEENT: Denies eye pain, eye redness, ear pain, ringing in the ears, wax buildup, runny nose, nasal congestion, bloody nose, or sore throat. Respiratory: Denies difficulty breathing, shortness of breath, cough or sputum production.   Cardiovascular: Denies chest pain, chest tightness, palpitations or swelling in the hands or feet.  Gastrointestinal: Denies abdominal pain, bloating, constipation, diarrhea or blood in the stool.  GU: Denies urgency, frequency, pain with urination, burning sensation, blood in urine, odor or discharge. Musculoskeletal: Denies decrease in range of motion, difficulty with gait, muscle pain or joint pain and swelling.  Skin: Denies redness, rashes, lesions or ulcercations.  Neurological: Denies dizziness, difficulty with memory, difficulty with speech or problems with balance and coordination.  Psych: Patient has a history of anxiety and depression.  Denies SI/HI.  No other specific complaints in a complete review of systems (except as listed in HPI above).  Objective:   Physical Exam BP 136/84 (BP Location: Left Arm, Patient Position: Sitting, Cuff Size: Large)   Pulse 79   Temp 97.7 F (36.5 C) (Temporal)   Wt 205 lb (93 kg)   LMP 06/28/2018   SpO2 99%   BMI 31.17 kg/m   Wt Readings from Last 3 Encounters:  07/02/21 207 lb 12.8 oz (94.3 kg)  12/11/20 212 lb (96.2 kg)  09/24/19 216 lb (98 kg)    General: Appears her stated age, obese, in NAD. Skin: Warm, dry and intact.  HEENT: Head: normal shape and size; Eyes: sclera white, no icterus, conjunctiva pink, PERRLA and EOMs intact;  Neck:  Neck supple, trachea midline. No masses, lumps or thyromegaly present.  Cardiovascular: Normal rate and rhythm. S1,S2 noted.  No murmur, rubs or gallops noted. No JVD or BLE edema. No carotid bruits noted. Pulmonary/Chest: Normal  effort and positive vesicular breath sounds. No respiratory distress. No wheezes, rales or ronchi noted.  Abdomen: Soft and nontender. Normal bowel sounds. No distention or masses noted. Liver, spleen and kidneys non palpable. Musculoskeletal: Strength 5/5 BUE/BLE. No difficulty with gait.  Neurological: Alert and oriented. Cranial nerves II-XII grossly intact. Coordination normal.  Psychiatric: Mood and affect normal. Behavior is normal. Judgment and thought content normal.     BMET    Component Value Date/Time   NA 139 07/02/2021 1347   K 4.2 07/02/2021 1347   CL 107 07/02/2021 1347   CO2 24 07/02/2021 1347   GLUCOSE 85  07/02/2021 1347   BUN 12 07/02/2021 1347   CREATININE 0.69 07/02/2021 1347   CALCIUM 9.4 07/02/2021 1347    Lipid Panel     Component Value Date/Time   CHOL 207 (H) 07/02/2021 1347   TRIG 121 07/02/2021 1347   HDL 76 07/02/2021 1347   CHOLHDL 2.7 07/02/2021 1347   VLDL 44.6 (H) 09/24/2019 1511   LDLCALC 108 (H) 07/02/2021 1347    CBC    Component Value Date/Time   WBC 5.5 07/02/2021 1347   RBC 3.74 (L) 07/02/2021 1347   HGB 10.8 (L) 07/02/2021 1347   HCT 33.0 (L) 07/02/2021 1347   PLT 262 07/02/2021 1347   MCV 88.2 07/02/2021 1347   MCH 28.9 07/02/2021 1347   MCHC 32.7 07/02/2021 1347   RDW 13.2 07/02/2021 1347    Hgb A1C Lab Results  Component Value Date   HGBA1C 5.4 07/02/2021            Assessment & Plan:     RTC in 6 months for your annual exam Nicki Reaper, NP

## 2022-01-19 NOTE — Assessment & Plan Note (Signed)
Continue fluoxetine, bupropion and hydroxyzine She will continue meet with her therapist and psychiatrist Support offered

## 2022-01-19 NOTE — Assessment & Plan Note (Signed)
Not currently treated We will monitor

## 2022-01-19 NOTE — Assessment & Plan Note (Signed)
No complaints of pain.

## 2022-01-19 NOTE — Patient Instructions (Signed)
Heart-Healthy Eating Plan Heart-healthy meal planning includes: Eating less unhealthy fats. Eating more healthy fats. Making other changes in your diet. Talk with your doctor or a diet specialist (dietitian) to create an eating plan that is right for you. What is my plan? Your doctor may recommend an eating plan that includes: Total fat: ______% or less of total calories a day. Saturated fat: ______% or less of total calories a day. Cholesterol: less than _________mg a day. What are tips for following this plan? Cooking Avoid frying your food. Try to bake, boil, grill, or broil it instead. You can also reduce fat by: Removing the skin from poultry. Removing all visible fats from meats. Steaming vegetables in water or broth. Meal planning  At meals, divide your plate into four equal parts: Fill one-half of your plate with vegetables and green salads. Fill one-fourth of your plate with whole grains. Fill one-fourth of your plate with lean protein foods. Eat 4-5 servings of vegetables per day. A serving of vegetables is: 1 cup of raw or cooked vegetables. 2 cups of raw leafy greens. Eat 4-5 servings of fruit per day. A serving of fruit is: 1 medium whole fruit.  cup of dried fruit.  cup of fresh, frozen, or canned fruit.  cup of 100% fruit juice. Eat more foods that have soluble fiber. These are apples, broccoli, carrots, beans, peas, and barley. Try to get 20-30 g of fiber per day. Eat 4-5 servings of nuts, legumes, and seeds per week: 1 serving of dried beans or legumes equals  cup after being cooked. 1 serving of nuts is  cup. 1 serving of seeds equals 1 tablespoon. General information Eat more home-cooked food. Eat less restaurant, buffet, and fast food. Limit or avoid alcohol. Limit foods that are high in starch and sugar. Avoid fried foods. Lose weight if you are overweight. Keep track of how much salt (sodium) you eat. This is important if you have high blood  pressure. Ask your doctor to tell you more about this. Try to add vegetarian meals each week. Fats Choose healthy fats. These include olive oil and canola oil, flaxseeds, walnuts, almonds, and seeds. Eat more omega-3 fats. These include salmon, mackerel, sardines, tuna, flaxseed oil, and ground flaxseeds. Try to eat fish at least 2 times each week. Check food labels. Avoid foods with trans fats or high amounts of saturated fat. Limit saturated fats. These are often found in animal products, such as meats, butter, and cream. These are also found in plant foods, such as palm oil, palm kernel oil, and coconut oil. Avoid foods with partially hydrogenated oils in them. These have trans fats. Examples are stick margarine, some tub margarines, cookies, crackers, and other baked goods. What foods can I eat? Fruits All fresh, canned (in natural juice), or frozen fruits. Vegetables Fresh or frozen vegetables (raw, steamed, roasted, or grilled). Green salads. Grains Most grains. Choose whole wheat and whole grains most of the time. Rice and pasta, including brown rice and pastas made with whole wheat. Meats and other proteins Lean, well-trimmed beef, veal, pork, and lamb. Chicken and turkey without skin. All fish and shellfish. Wild duck, rabbit, pheasant, and venison. Egg whites or low-cholesterol egg substitutes. Dried beans, peas, lentils, and tofu. Seeds and most nuts. Dairy Low-fat or nonfat cheeses, including ricotta and mozzarella. Skim or 1% milk that is liquid, powdered, or evaporated. Buttermilk that is made with low-fat milk. Nonfat or low-fat yogurt. Fats and oils Non-hydrogenated (trans-free) margarines. Vegetable oils, including   soybean, sesame, sunflower, olive, peanut, safflower, corn, canola, and cottonseed. Salad dressings or mayonnaise made with a vegetable oil. Beverages Mineral water. Coffee and tea. Diet carbonated beverages. Sweets and desserts Sherbet, gelatin, and fruit ice.  Small amounts of dark chocolate. Limit all sweets and desserts. Seasonings and condiments All seasonings and condiments. The items listed above may not be a complete list of foods and drinks you can eat. Contact a dietitian for more options. What foods should I avoid? Fruits Canned fruit in heavy syrup. Fruit in cream or butter sauce. Fried fruit. Limit coconut. Vegetables Vegetables cooked in cheese, cream, or butter sauce. Fried vegetables. Grains Breads that are made with saturated or trans fats, oils, or whole milk. Croissants. Sweet rolls. Donuts. High-fat crackers, such as cheese crackers. Meats and other proteins Fatty meats, such as hot dogs, ribs, sausage, bacon, rib-eye roast or steak. High-fat deli meats, such as salami and bologna. Caviar. Domestic duck and goose. Organ meats, such as liver. Dairy Cream, sour cream, cream cheese, and creamed cottage cheese. Whole-milk cheeses. Whole or 2% milk that is liquid, evaporated, or condensed. Whole buttermilk. Cream sauce or high-fat cheese sauce. Yogurt that is made from whole milk. Fats and oils Meat fat, or shortening. Cocoa butter, hydrogenated oils, palm oil, coconut oil, palm kernel oil. Solid fats and shortenings, including bacon fat, salt pork, lard, and butter. Nondairy cream substitutes. Salad dressings with cheese or sour cream. Beverages Regular sodas and juice drinks with added sugar. Sweets and desserts Frosting. Pudding. Cookies. Cakes. Pies. Milk chocolate or white chocolate. Buttered syrups. Full-fat ice cream or ice cream drinks. The items listed above may not be a complete list of foods and drinks to avoid. Contact a dietitian for more information. Summary Heart-healthy meal planning includes eating less unhealthy fats, eating more healthy fats, and making other changes in your diet. Eat a balanced diet. This includes fruits and vegetables, low-fat or nonfat dairy, lean protein, nuts and legumes, whole grains, and  heart-healthy oils and fats. This information is not intended to replace advice given to you by your health care provider. Make sure you discuss any questions you have with your health care provider. Document Revised: 10/23/2020 Document Reviewed: 10/23/2020 Elsevier Patient Education  2022 [employer] Inc[employer]  

## 2022-01-19 NOTE — Assessment & Plan Note (Signed)
A1c today.  

## 2022-01-19 NOTE — Assessment & Plan Note (Signed)
Encourage diet and exercise for weight loss 

## 2022-01-19 NOTE — Assessment & Plan Note (Signed)
Controlled on lisinopril, refilled today C-Met today Reinforced DASH diet and exercise for weight loss

## 2022-01-19 NOTE — Assessment & Plan Note (Signed)
C-Met and lipid profile today Encouraged her to consume a low-fat diet 

## 2022-01-19 NOTE — Assessment & Plan Note (Signed)
Avoid foods that trigger reflux Encourage weight loss as this can help reduce reflux symptoms Continue omeprazole as needed, refilled today

## 2022-01-20 ENCOUNTER — Encounter: Payer: Self-pay | Admitting: Internal Medicine

## 2022-01-20 DIAGNOSIS — E781 Pure hyperglyceridemia: Secondary | ICD-10-CM

## 2022-01-20 LAB — LIPID PANEL
Cholesterol: 214 mg/dL — ABNORMAL HIGH (ref <200)
HDL: 71 mg/dL (ref >=50)
LDL Cholesterol (Calc): 105 mg/dL (calc) — ABNORMAL HIGH
Non-HDL Cholesterol (Calc): 143 mg/dL (calc) — ABNORMAL HIGH (ref <130)
Total CHOL/HDL Ratio: 3 (calc) (ref <5.0)
Triglycerides: 265 mg/dL — ABNORMAL HIGH (ref <150)

## 2022-01-20 LAB — COMPLETE METABOLIC PANEL WITH GFR
AG Ratio: 1.7 (calc) (ref 1.0–2.5)
ALT: 18 U/L (ref 6–29)
AST: 15 U/L (ref 10–35)
Albumin: 4.5 g/dL (ref 3.6–5.1)
Alkaline phosphatase (APISO): 83 U/L (ref 37–153)
BUN: 18 mg/dL (ref 7–25)
CO2: 24 mmol/L (ref 20–32)
Calcium: 9.7 mg/dL (ref 8.6–10.4)
Chloride: 105 mmol/L (ref 98–110)
Creat: 0.64 mg/dL (ref 0.50–1.03)
Globulin: 2.6 g/dL (calc) (ref 1.9–3.7)
Glucose, Bld: 87 mg/dL (ref 65–139)
Potassium: 4.6 mmol/L (ref 3.5–5.3)
Sodium: 138 mmol/L (ref 135–146)
Total Bilirubin: 0.3 mg/dL (ref 0.2–1.2)
Total Protein: 7.1 g/dL (ref 6.1–8.1)
eGFR: 106 mL/min/{1.73_m2} (ref >=60)

## 2022-01-20 LAB — HEMOGLOBIN A1C
Hgb A1c MFr Bld: 5.3 % of total Hgb (ref <5.7)
Mean Plasma Glucose: 105 mg/dL
eAG (mmol/L): 5.8 mmol/L

## 2022-01-20 LAB — CBC
HCT: 35.7 % (ref 35.0–45.0)
Hemoglobin: 11.8 g/dL (ref 11.7–15.5)
MCH: 29.1 pg (ref 27.0–33.0)
MCHC: 33.1 g/dL (ref 32.0–36.0)
MCV: 87.9 fL (ref 80.0–100.0)
MPV: 9.9 fL (ref 7.5–12.5)
Platelets: 287 10*3/uL (ref 140–400)
RBC: 4.06 10*6/uL (ref 3.80–5.10)
RDW: 13.1 % (ref 11.0–15.0)
WBC: 5.6 10*3/uL (ref 3.8–10.8)

## 2022-01-20 MED ORDER — ATORVASTATIN CALCIUM 10 MG PO TABS: 10.0000 mg | ORAL_TABLET | Freq: Every day | ORAL | 1 refills | Status: DC

## 2022-01-27 ENCOUNTER — Ambulatory Visit (INDEPENDENT_AMBULATORY_CARE_PROVIDER_SITE_OTHER): Payer: 59 | Admitting: Psychology

## 2022-01-27 DIAGNOSIS — F331 Major depressive disorder, recurrent, moderate: Secondary | ICD-10-CM

## 2022-01-27 DIAGNOSIS — F411 Generalized anxiety disorder: Secondary | ICD-10-CM

## 2022-01-27 NOTE — Progress Notes (Signed)
Behavioral Health Counselor/Therapist Progress Note  Patient ID: Melissa Cowan, MRN: [id number],    Date: 01/27/2022  Time Spent: 60 minutes  Treatment Type: Individual Therapy  Reported Symptoms: depression and anxiety  Mental Status Exam: Appearance:  Casual     Behavior: Appropriate  Motor: Normal  Speech/Language:  Normal Rate  Affect: normal  Mood: normal  Thought process: normal  Thought content:   WNL  Sensory/Perceptual disturbances:   WNL  Orientation: oriented to person, place, time/date, and situation  Attention: Good  Concentration: Good  Memory: WNL  Fund of knowledge:  Good  Insight:   Good  Judgment:  Good  Impulse Control: Good   Risk Assessment: Danger to Self:  No Self-injurious Behavior: No Danger to Others: No Duty to Warn:no Physical Aggression / Violence:No  Access to Firearms a concern: No  Gang Involvement:No   Subjective: The patient attended a face-to-face individual therapy session via video visit today.  The patient gave verbal consent for the video visit to be on WebEx.  The patient was in her home alone and the therapist was in her office.  The patient presents as pleasant and cooperative.  The patient states that she went to see Yvette Rack at [facility] and she changed some of her medications and she feels much better..  The patient was upbeat and reported that things in life were going better for her and she felt better about work and about her relationship that she is involved in.  She does have a death date anniversary of her father's death on 2024-08-14coming up and this did bring some tears to her eyes about that.  I explained to her that this was normal for her to still be having those kinds of emotions close to an anniversary date.  We talked about the patient continuing to take care of herself and set limits like she needs to do so that she can behaviorally manage some of the things that have caused her problems in the past.   The patient is doing very well and we scheduled an appointment for 7 weeks away but she is aware that she can contact me if she needs to get in sooner.  Interventions: Cognitive Behavioral Therapy  Diagnosis:Generalized anxiety disorder  Major depressive disorder, recurrent episode, moderate (HCC)  Plan: Treatment Plan  Strengths/Abilities:  Intelligent, caring, ability for insight, motivated   Treatment Preferences:  Outpatient Individual therapy  Statement of Needs:  " I need help with depression and anxiety"  Symptoms:  Depressed or irritable mood.: (Status: improved). Diminished interest in or  enjoyment of activities.: (Status: improved). History of chronic or recurrent  depression for which the client has taken antidepressant medication, been hospitalized, had outpatient  treatment, or had a course of electroconvulsive therapy.: (Status: maintained).  Lack of energy.: (Status: improved). Motor tension (e.g., restlessness,  tiredness, shakiness, muscle tension).: (Status: improved). Poor concentration  and indecisiveness.: (Status: improved). Social withdrawal.:  (Status: maintained).   Problems Addressed: Unipolar depression and anxiety  Goals: 1. Alleviate depressive symptoms and return to previous level of effective  Functioning Target Date: 2022-03-20 Frequency: Monthly Progress: 40 Modality: individual 2. Appropriately grieve the loss in order to normalize mood and to return  to previously adaptive level of functioning Target Date: 2022-03-20 Frequency: Monthly Progress: 70 Modality: individual 3. Develop healthy interpersonal relationships that lead to the alleviation  and help prevent the relapse of depression. 4. Develop healthy thinking patterns and beliefs about self, others, and the world that  lead to the alleviation and help prevent the relapse of  depression. 5. Recognize, accept, and cope with feelings of depression. 6. Stabilize anxiety level while  increasing ability to function on a daily  basis. Objective Learn and implement calming skills to reduce overall anxiety and manage anxiety symptoms. Target Date: 2022-03-20 Frequency: Monthly Progress: 30 Modality: individual    Interventions by Therapist:  CBT, insight oriented approach, grief counseling,  problem solving.  The patient is making progress and has approved this plan.  Aubriella Perezgarcia G Chuck Caban, LCSW                  Davonn Flanery G Jackline Castilla, LCSW               Tasnia Spegal G Axavier Pressley, LCSW               Hokulani Rogel G Saige Busby, LCSW               Nirel Babler G Sorcha Rotunno, LCSW               Fabiano Ginley G Mary-Anne Polizzi, LCSW

## 2022-02-01 ENCOUNTER — Encounter: Payer: Self-pay | Admitting: Adult Health

## 2022-02-01 ENCOUNTER — Ambulatory Visit (INDEPENDENT_AMBULATORY_CARE_PROVIDER_SITE_OTHER): Payer: 59 | Admitting: Adult Health

## 2022-02-01 DIAGNOSIS — F411 Generalized anxiety disorder: Secondary | ICD-10-CM | POA: Diagnosis not present

## 2022-02-01 DIAGNOSIS — F331 Major depressive disorder, recurrent, moderate: Secondary | ICD-10-CM | POA: Diagnosis not present

## 2022-02-01 NOTE — Progress Notes (Signed)
Melissa Cowan [id number] [birth year] 56 y.o.  Subjective:   Patient ID:  Melissa Cowan is a 56 y.o. (DOB 1965/07/08) female.  Chief Complaint: No chief complaint on file.   HPI Melissa Cowan presents to the office today for follow-up of Referred by therapist - Bambi Cottle.  Describes mood today as "not so good". Pleasant. Tearful at times. Mood symptoms - reports decreased depression, anxiety, and irritability. Reports decreased worry and rumination. Decreased obsessive behaviors. Reports decreased brain fog - still forgetting and having difficulties recalling things. Reports decreased procrastination. Improved ambition. Feels distracted - "able to focus better with music, it keeps me grounded". Continues to take Prozac 40mg  daily. Feels like the increase in Wellbutrin has been helpful - taking the 300mg  dose, but plans to increase to 450mg  daily. Stating "I don't feel like things are coming down on me like they were". Continues like she also struggles with the loss of both parents and dog. Has been working with a therapist for the past 6 years - .Decreased interest and motivation. Taking medications as prescribed.  Energy levels lower. Active, does not have a regular exercise routine - went once. Enjoys some usual interests and activities. Lives alone. Has a boyfriend. Sister in [place]. Parents deceased. Friends local and supportive. Appetite adequate. Weight stable - 206 pounds. Sleeps well most nights. Averages 6 hours. Considering consult for sleep apnea. Focus and concentration difficulties - more so as an adult. Completing tasks. Managing aspects of household. 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  Denies AH or VH. Denies self harm. History of cutting - over a break up.  Denies substance use.  Previous medication trials:  Buspar, Lexapro   GAD-7    Flowsheet Row Office Visit from 01/19/2022 in [facility] Office Visit from 12/11/2020 in [facility] Office Visit from 09/24/2019 in [facility]  Total GAD-7 Score 7 2 0      PHQ2-9    Flowsheet Row Office Visit from 01/19/2022 in [facility] Office Visit from 07/02/2021 in [facility] Office Visit from 12/11/2020 in [facility] Office Visit from 09/24/2019 in [facility] Visit from 09/06/2018 in [facility]  PHQ-2 Total Score 4 1 4  0 0  PHQ-9 Total Score 13 -- 15 0 0        Review of Systems:  Review of Systems  Musculoskeletal:  Negative for gait problem.  Neurological:  Negative for tremors.  Psychiatric/Behavioral:         Please refer to HPI    Medications: I have reviewed the patient's current medications.  Current Outpatient Medications  Medication Sig Dispense Refill   atorvastatin (LIPITOR) 10 MG tablet Take 1 tablet (10 mg total) by mouth daily. 90 tablet 1   Black Cohosh 200 MG CAPS Take by mouth.     buPROPion (WELLBUTRIN XL) 150 MG 24 hr tablet Take three tablets every morning. 90 tablet 1   Cholecalciferol (VITAMIN D3) 1.25 MG (50000 UT) TABS Take by mouth.     Cyanocobalamin 2000 MCG/ML SOLN Inject as directed.     FLUoxetine (PROZAC) 20 MG capsule Take 3 capsules (60 mg total) by mouth daily. 270 capsule 0   fluticasone (FLONASE) 50 MCG/ACT nasal spray Place 2 sprays into both nostrils daily. 16 g 0   hydrOXYzine (ATARAX) 25 MG tablet 1 TABLET BY MOUTH 3X A DAY AS NEEDED FOR ANXIETY, MAY  TAKE EXTRA DOSE ONCE IF NEEDED, MAX 50MG  DOSE 90 tablet 1   ibuprofen (ADVIL) 800 MG tablet Take 800 mg by mouth 3 (three) times daily.     lisinopril (ZESTRIL) 20 MG tablet Take 1 tablet (20 mg total) by mouth daily. 90 tablet 1   MAGNESIUM CITRATE PO Take by mouth.     omeprazole (PRILOSEC) 20 MG capsule Take 1 capsule (20 mg total) by mouth daily. 90 capsule 1   No current facility-administered medications for this visit.    Medication  Side Effects: None  Allergies:  Allergies  Allergen Reactions   Amoxicillin    Amoxicillin-Pot Clavulanate Rash   Tetracycline Rash    Past Medical History:  Diagnosis Date   Allergy    Depression    Endometriosis determined by laparoscopy 1993   GERD (gastroesophageal reflux disease)    Hypertension     Past Medical History, Surgical history, Social history, and Family history were reviewed and updated as appropriate.   Please see review of systems for further details on the patient's review from today.   Objective:   Physical Exam:  LMP 06/28/2018   Physical Exam Constitutional:      General: She is not in acute distress. Musculoskeletal:        General: No deformity.  Neurological:     Mental Status: She is alert and oriented to person, place, and time.     Coordination: Coordination normal.  Psychiatric:        Attention and Perception: Attention and perception normal. She does not perceive auditory or visual hallucinations.        Mood and Affect: Mood normal. Mood is not anxious or depressed. Affect is not labile, blunt, angry or inappropriate.        Speech: Speech normal.        Behavior: Behavior normal.        Thought Content: Thought content normal. Thought content is not paranoid or delusional. Thought content does not include homicidal or suicidal ideation. Thought content does not include homicidal or suicidal plan.        Cognition and Memory: Cognition and memory normal.        Judgment: Judgment normal.     Comments: Insight intact     Lab Review:     Component Value Date/Time   NA 138 01/19/2022 1523   K 4.6 01/19/2022 1523   CL 105 01/19/2022 1523   CO2 24 01/19/2022 1523   GLUCOSE 87 01/19/2022 1523   BUN 18 01/19/2022 1523   CREATININE 0.64 01/19/2022 1523   CALCIUM 9.7 01/19/2022 1523   PROT 7.1 01/19/2022 1523   ALBUMIN 4.0 09/24/2019 1511   AST 15 01/19/2022 1523   ALT 18 01/19/2022 1523   ALKPHOS 83 09/24/2019 1511   BILITOT 0.3  01/19/2022 1523       Component Value Date/Time   WBC 5.6 01/19/2022 1523   RBC 4.06 01/19/2022 1523   HGB 11.8 01/19/2022 1523   HCT 35.7 01/19/2022 1523   PLT 287 01/19/2022 1523   MCV 87.9 01/19/2022 1523   MCH 29.1 01/19/2022 1523   MCHC 33.1 01/19/2022 1523   RDW 13.1 01/19/2022 1523    No results found for: "POCLITH", "LITHIUM"   No results found for: "PHENYTOIN", "PHENOBARB", "VALPROATE", "CBMZ"   .res Assessment: Plan:    Plan:  PDMP reviewed  Continue Proazc 40mg  daily Increase Wellbutrin XL 300mg  every morning to 450mg  every morning - denies seizure history.   Time  spent with patient was 20 minutes. Greater than 50% of face to face time with patient was spent on counseling and coordination of care.    RTC 4 weeks  Patient advised to contact office with any questions, adverse effects, or acute worsening in signs and symptoms.   Diagnoses and all orders for this visit:  Major depressive disorder, recurrent episode, moderate (HCC)  Generalized anxiety disorder     Please see After Visit Summary for patient specific instructions.  Future Appointments  Date Time Provider Department Center  07/22/2022  8:40 AM Lorre Munroe, NP [facility]    No orders of the defined types were placed in this encounter.   -------------------------------

## 2022-02-16 ENCOUNTER — Other Ambulatory Visit: Payer: Self-pay | Admitting: Adult Health

## 2022-02-16 DIAGNOSIS — F331 Major depressive disorder, recurrent, moderate: Secondary | ICD-10-CM

## 2022-02-16 DIAGNOSIS — F411 Generalized anxiety disorder: Secondary | ICD-10-CM

## 2022-03-17 ENCOUNTER — Ambulatory Visit
Admission: RE | Admit: 2022-03-17 | Discharge: 2022-03-17 | Disposition: A | Discharge status: Home / Self Care | Payer: 59 | Source: Ambulatory Visit | Attending: Internal Medicine | Admitting: Internal Medicine

## 2022-03-17 ENCOUNTER — Ambulatory Visit (INDEPENDENT_AMBULATORY_CARE_PROVIDER_SITE_OTHER): Payer: 59 | Admitting: Psychology

## 2022-03-17 DIAGNOSIS — F411 Generalized anxiety disorder: Secondary | ICD-10-CM

## 2022-03-17 DIAGNOSIS — F331 Major depressive disorder, recurrent, moderate: Secondary | ICD-10-CM

## 2022-03-17 DIAGNOSIS — Z1231 Encounter for screening mammogram for malignant neoplasm of breast: Secondary | ICD-10-CM | POA: Insufficient documentation

## 2022-03-18 NOTE — Progress Notes (Signed)
[facility] Counselor/Therapist Progress Note  Patient ID: Melissa Cowan, MRN: [id number],    Date: 03/17/2022  Time Spent: 60 minutes  Treatment Type: Individual Therapy  Reported Symptoms: depression and anxiety  Mental Status Exam: Appearance:  Casual     Behavior: Appropriate  Motor: Normal  Speech/Language:  Normal Rate  Affect: normal  Mood: depressed  Thought process: normal  Thought content:   WNL  Sensory/Perceptual disturbances:   WNL  Orientation: oriented to person, place, time/date, and situation  Attention: Good  Concentration: Good  Memory: WNL  Fund of knowledge:  Good  Insight:   Good  Judgment:  Good  Impulse Control: Good   Risk Assessment: Danger to Self:  No Self-injurious Behavior: No Danger to Others: No Duty to Warn:no Physical Aggression / Violence:No  Access to Firearms a concern: No  Gang Involvement:No   Subjective: The patient attended a face-to-face individual therapy session via video visit today.  The patient gave verbal consent for the video visit to be on WebEx.  The patient was in her home alone and the therapist was in her office.  The patient presents with a blunted affect and mood is depressed.  The patient was tearful at times during this session.  The patient reports that she has been okay, but as we talked it seems that she is struggling with aging and had a friend who passed away at 89 years old.  We talked about leaning into the aging process and talked about strategies to help herself be more secure.  The patient is also struggling because she doesn't have parents to fall back on now.  She feels very alone.  We discussed her feelings about this.  The patient is also struggling with the holidays coming up and she is not getting her needs met with the relationship that she is in.  We discussed her coming up with her own strategy for how she wants her aging to go.  She also reports that she is having some issues with  work.  We talked about her having the option of going somewhere else, but that it might not be worth leaving if she is going to retire in several years.   Interventions: Cognitive Behavioral Therapy  Diagnosis:Major depressive disorder, recurrent episode, moderate (HCC)  Generalized anxiety disorder  Plan: Treatment Plan  Strengths/Abilities:  Intelligent, caring, ability for insight, motivated   Treatment Preferences:  Outpatient Individual therapy  Statement of Needs:  " I need help with depression and anxiety"  Symptoms:  Depressed or irritable mood.: (Status: improved). Diminished interest in or  enjoyment of activities.: (Status: improved). History of chronic or recurrent  depression for which the client has taken antidepressant medication, been hospitalized, had outpatient  treatment, or had a course of electroconvulsive therapy.: (Status: maintained).  Lack of energy.: (Status: improved). Motor tension (e.g., restlessness,  tiredness, shakiness, muscle tension).: (Status: improved). Poor concentration  and indecisiveness.: (Status: improved). Social withdrawal.:  (Status: maintained).   Problems Addressed: Unipolar depression and anxiety  Goals: 1. Alleviate depressive symptoms and return to previous level of effective  Functioning Target Date: 2023-03-21 Frequency: Monthly Progress: 40 Modality: individual 2. Appropriately grieve the loss in order to normalize mood and to return  to previously adaptive level of functioning Target Date: 2023-03-21 Frequency: Monthly Progress: 70 Modality: individual 3. Develop healthy interpersonal relationships that lead to the alleviation  and help prevent the relapse of depression. 4. Develop healthy thinking patterns and beliefs about self, others, and  the world that lead to the alleviation and help prevent the relapse of  depression. 5. Recognize, accept, and cope with feelings of depression. 6. Stabilize anxiety level while  increasing ability to function on a daily  basis. Objective Learn and implement calming skills to reduce overall anxiety and manage anxiety symptoms. Target Date: 2023-03-21 Frequency: Monthly Progress: 30 Modality: individual    Interventions by Therapist:  CBT, insight oriented approach, grief counseling,  problem solving.  The patient is making progress and has approved this plan.  Toluwani Ruder G Ambrea Hegler, LCSW

## 2022-04-10 ENCOUNTER — Other Ambulatory Visit: Payer: Self-pay | Admitting: Adult Health

## 2022-04-10 DIAGNOSIS — F411 Generalized anxiety disorder: Secondary | ICD-10-CM

## 2022-05-03 ENCOUNTER — Other Ambulatory Visit: Payer: Self-pay | Admitting: Adult Health

## 2022-05-03 ENCOUNTER — Ambulatory Visit: Payer: 59 | Admitting: Adult Health

## 2022-05-03 DIAGNOSIS — F331 Major depressive disorder, recurrent, moderate: Secondary | ICD-10-CM

## 2022-05-03 DIAGNOSIS — F411 Generalized anxiety disorder: Secondary | ICD-10-CM

## 2022-05-04 ENCOUNTER — Ambulatory Visit (INDEPENDENT_AMBULATORY_CARE_PROVIDER_SITE_OTHER): Payer: 59 | Admitting: Psychology

## 2022-05-04 DIAGNOSIS — F411 Generalized anxiety disorder: Secondary | ICD-10-CM | POA: Diagnosis not present

## 2022-05-04 DIAGNOSIS — F331 Major depressive disorder, recurrent, moderate: Secondary | ICD-10-CM

## 2022-05-04 NOTE — Progress Notes (Signed)
[facility] Counselor/Therapist Progress Note  Patient ID: Melissa Cowan, MRN: [id number],    Date: 05/04/2022  Time Spent: 60 minutes  Treatment Type: Individual Therapy  Reported Symptoms: depression and anxiety  Mental Status Exam: Appearance:  Casual     Behavior: Appropriate  Motor: Normal  Speech/Language:  Normal Rate  Affect: normal  Mood: normal  Thought process: normal  Thought content:   WNL  Sensory/Perceptual disturbances:   WNL  Orientation: oriented to person, place, time/date, and situation  Attention: Good  Concentration: Good  Memory: WNL  Fund of knowledge:  Good  Insight:   Good  Judgment:  Good  Impulse Control: Good   Risk Assessment: Danger to Self:  No Self-injurious Behavior: No Danger to Others: No Duty to Warn:no Physical Aggression / Violence:No  Access to Firearms a concern: No  Gang Involvement:No   Subjective: The patient attended a face-to-face individual therapy session via video visit today.  The patient gave verbal consent for the video visit to be on WebEx.  The patient was in her home alone and the therapist was in her office.  The patient presents as pleasant and cooperative.  The patient seems to be doing better now that her medicine has been changed.  The patient reports that she has been doing things around the house more and feeling a lot more energetic.  She states that she has not been feeling as depressed or sad or anxious.  We talked about her continuing to do things that help her feel better.  She is still involved in that relationship that I am not sure is very rewarding to her but she is not willing to do anything about that.  She wants to continue to find things that make her happy and she continues to explore these options.  Interventions: Cognitive Behavioral Therapy  Diagnosis:Major depressive disorder, recurrent episode, moderate (HCC)  Generalized anxiety disorder  Plan: Treatment  Plan  Strengths/Abilities:  Intelligent, caring, ability for insight, motivated   Treatment Preferences:  Outpatient Individual therapy  Statement of Needs:  " I need help with depression and anxiety"  Symptoms:  Depressed or irritable mood.: (Status: improved). Diminished interest in or  enjoyment of activities.: (Status: improved). History of chronic or recurrent  depression for which the client has taken antidepressant medication, been hospitalized, had outpatient  treatment, or had a course of electroconvulsive therapy.: (Status: maintained).  Lack of energy.: (Status: improved). Motor tension (e.g., restlessness,  tiredness, shakiness, muscle tension).: (Status: improved). Poor concentration  and indecisiveness.: (Status: improved). Social withdrawal.:  (Status: maintained).   Problems Addressed: Unipolar depression and anxiety  Goals: 1. Alleviate depressive symptoms and return to previous level of effective  Functioning Target Date: 2023-03-21 Frequency: Monthly Progress: 40 Modality: individual 2. Appropriately grieve the loss in order to normalize mood and to return  to previously adaptive level of functioning Target Date: 2023-03-21 Frequency: Monthly Progress: 70 Modality: individual 3. Develop healthy interpersonal relationships that lead to the alleviation  and help prevent the relapse of depression. 4. Develop healthy thinking patterns and beliefs about self, others, and the world that lead to the alleviation and help prevent the relapse of  depression. 5. Recognize, accept, and cope with feelings of depression. 6. Stabilize anxiety level while increasing ability to function on a daily  basis. Objective Learn and implement calming skills to reduce overall anxiety and manage anxiety symptoms. Target Date: 2023-03-21 Frequency: Monthly Progress: 30 Modality: individual    Interventions by Therapist:  CBT, insight oriented approach, grief counseling,  problem  solving.  The patient is making progress and has approved this plan.  Lucca Greggs G Safwan Tomei, LCSW

## 2022-05-06 ENCOUNTER — Encounter: Payer: Self-pay | Admitting: Adult Health

## 2022-05-06 ENCOUNTER — Telehealth (INDEPENDENT_AMBULATORY_CARE_PROVIDER_SITE_OTHER): Payer: 59 | Admitting: Adult Health

## 2022-05-06 DIAGNOSIS — F411 Generalized anxiety disorder: Secondary | ICD-10-CM | POA: Diagnosis not present

## 2022-05-06 DIAGNOSIS — F331 Major depressive disorder, recurrent, moderate: Secondary | ICD-10-CM | POA: Diagnosis not present

## 2022-05-06 NOTE — Progress Notes (Signed)
Melissa Cowan [id number] 20-Jan-1966 56 y.o.  Virtual Visit via Video Note  I connected with pt @ on 05/06/22 at  3:00 PM EST by a video enabled telemedicine application and verified that I am speaking with the correct person using two identifiers.   I discussed the limitations of evaluation and management by telemedicine and the availability of in person appointments. The patient expressed understanding and agreed to proceed.  I discussed the assessment and treatment plan with the patient. The patient was provided an opportunity to ask questions and all were answered. The patient agreed with the plan and demonstrated an understanding of the instructions.   The patient was advised to call back or seek an in-person evaluation if the symptoms worsen or if the condition fails to improve as anticipated.  I provided 15 minutes of non-face-to-face time during this encounter.  The patient was located at home.  The provider was located at [facility].   Dorothyann Gibbs, NP   Subjective:   Patient ID:  Melissa Cowan is a 56 y.o. (DOB [birth year]) female.  Chief Complaint: No chief complaint on file.   HPI [name] presents for follow-up of Referred by therapist - Bambi Cottle.  Describes mood today as "improved". Pleasant. Decreased tearfulness. Mood symptoms - reports decreased depression, anxiety, and irritability. Reports decreased worry and rumination - decreased over thinking. Reports decreased obsessive behaviors. Reports decreased procrastination. Reports decreased brain fog - "getting better". Stating 'I'm feeling better". Feels like the Wellbutrin XL 450mg  and Prozac 40mg  daily works well for. Has been working with a therapist for the past 6 years - .Decreased interest and motivation. Taking medications as prescribed.  Energy levels improved. Active, exercising - walking. Enjoys some usual interests and activities. Lives alone. Has a boyfriend. Sister in  [place]. Parents deceased. Friends local and supportive. Appetite adequate. Weight loss - 5 pounds. Sleeps well most nights. Averages 6 hours.  Focus and concentration improved - "it's better". Completing tasks. 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 Denies SI or HI.  Denies AH or VH. Denies self harm. History of cutting - over a break up.  Denies substance use.  Previous medication trials:  Buspar, Lexapro    Review of Systems:  Review of Systems  Musculoskeletal:  Negative for gait problem.  Neurological:  Negative for tremors.  Psychiatric/Behavioral:         Please refer to HPI    Medications: I have reviewed the patient's current medications.  Current Outpatient Medications  Medication Sig Dispense Refill   atorvastatin (LIPITOR) 10 MG tablet Take 1 tablet (10 mg total) by mouth daily. 90 tablet 1   Black Cohosh 200 MG CAPS Take by mouth.     buPROPion (WELLBUTRIN XL) 150 MG 24 hr tablet TAKE THREE TABLETS EVERY MORNING. 90 tablet 0   Cholecalciferol (VITAMIN D3) 1.25 MG (50000 UT) TABS Take by mouth.     Cyanocobalamin 2000 MCG/ML SOLN Inject as directed.     FLUoxetine (PROZAC) 20 MG capsule Take 2 capsules (40 mg total) by mouth daily. 60 capsule 1   fluticasone (FLONASE) 50 MCG/ACT nasal spray Place 2 sprays into both nostrils daily. 16 g 0   hydrOXYzine (ATARAX) 25 MG tablet 1 TABLET BY MOUTH 3X A DAY AS NEEDED FOR ANXIETY, MAY TAKE EXTRA DOSE ONCE IF NEEDED, MAX 50MG  DOSE 90 tablet 1   ibuprofen (ADVIL) 800 MG tablet Take 800 mg by mouth 3 (three) times daily.     lisinopril (ZESTRIL)  20 MG tablet Take 1 tablet (20 mg total) by mouth daily. 90 tablet 1   MAGNESIUM CITRATE PO Take by mouth.     omeprazole (PRILOSEC) 20 MG capsule Take 1 capsule (20 mg total) by mouth daily. 90 capsule 1   No current facility-administered medications for this visit.    Medication Side Effects: None  Allergies:  Allergies  Allergen Reactions   Amoxicillin     Amoxicillin-Pot Clavulanate Rash   Tetracycline Rash    Past Medical History:  Diagnosis Date   Allergy    Depression    Endometriosis determined by laparoscopy 1993   GERD (gastroesophageal reflux disease)    Hypertension     Family History  Problem Relation Age of Onset   Alcohol abuse Mother    Drug abuse Mother    Cancer Mother        uterine   Alcohol abuse Father    Diabetes Father    Stroke Maternal Grandmother    Kidney disease Maternal Grandmother    Breast cancer Neg Hx     Social History   Socioeconomic History   Marital status: Divorced    Spouse name: Not on file   Number of children: Not on file   Years of education: Not on file   Highest education level: Not on file  Occupational History   Not on file  Tobacco Use   Smoking status: Former    Types: Cigarettes   Smokeless tobacco: Never   Tobacco comments:    quit x 15 years ago  Vaping Use   Vaping Use: Not on file  Substance and Sexual Activity   Alcohol use: Yes    Alcohol/week: 0.0 standard drinks of alcohol    Comment: social   Drug use: Not Currently    Types: Cocaine   Sexual activity: Not Currently  Other Topics Concern   Not on file  Social History Narrative   Not on file   Social Determinants of Health   Financial Resource Strain: Not on file  Food Insecurity: Not on file  Transportation Needs: Not on file  Physical Activity: Not on file  Stress: Not on file  Social Connections: Not on file  Intimate Partner Violence: Not on file    Past Medical History, Surgical history, Social history, and Family history were reviewed and updated as appropriate.   Please see review of systems for further details on the patient's review from today.   Objective:   Physical Exam:  LMP 06/28/2018   Physical Exam Constitutional:      General: She is not in acute distress. Musculoskeletal:        General: No deformity.  Neurological:     Mental Status: She is alert and oriented to  person, place, and time.     Coordination: Coordination normal.  Psychiatric:        Attention and Perception: Attention and perception normal. She does not perceive auditory or visual hallucinations.        Mood and Affect: Mood normal. Mood is not anxious or depressed. Affect is not labile, blunt, angry or inappropriate.        Speech: Speech normal.        Behavior: Behavior normal.        Thought Content: Thought content normal. Thought content is not paranoid or delusional. Thought content does not include homicidal or suicidal ideation. Thought content does not include homicidal or suicidal plan.        Cognition and  Memory: Cognition and memory normal.        Judgment: Judgment normal.     Comments: Insight intact     Lab Review:     Component Value Date/Time   NA 138 01/19/2022 1523   K 4.6 01/19/2022 1523   CL 105 01/19/2022 1523   CO2 24 01/19/2022 1523   GLUCOSE 87 01/19/2022 1523   BUN 18 01/19/2022 1523   CREATININE 0.64 01/19/2022 1523   CALCIUM 9.7 01/19/2022 1523   PROT 7.1 01/19/2022 1523   ALBUMIN 4.0 09/24/2019 1511   AST 15 01/19/2022 1523   ALT 18 01/19/2022 1523   ALKPHOS 83 09/24/2019 1511   BILITOT 0.3 01/19/2022 1523       Component Value Date/Time   WBC 5.6 01/19/2022 1523   RBC 4.06 01/19/2022 1523   HGB 11.8 01/19/2022 1523   HCT 35.7 01/19/2022 1523   PLT 287 01/19/2022 1523   MCV 87.9 01/19/2022 1523   MCH 29.1 01/19/2022 1523   MCHC 33.1 01/19/2022 1523   RDW 13.1 01/19/2022 1523    No results found for: "POCLITH", "LITHIUM"   No results found for: "PHENYTOIN", "PHENOBARB", "VALPROATE", "CBMZ"   .res Assessment: Plan:    Plan:  PDMP reviewed  Proazc 40mg  daily Wellbutrin XL 450mg  every morning - denies seizure history.   Time spent with patient was 20 minutes. Greater than 50% of face to face time with patient was spent on counseling and coordination of care.    RTC 3 months  Patient advised to contact office with any  questions, adverse effects, or acute worsening in signs and symptoms.  Diagnoses and all orders for this visit:  Major depressive disorder, recurrent episode, moderate (HCC)  Generalized anxiety disorder     Please see After Visit Summary for patient specific instructions.  Future Appointments  Date Time Provider Department Center  06/15/2022  4:00 PM Cottle, , LCSW [facility] None  07/22/2022  8:40 AM Baity, Lynnell Dike, NP [facility]    No orders of the defined types were placed in this encounter.     -------------------------------

## 2022-06-01 ENCOUNTER — Other Ambulatory Visit: Payer: Self-pay | Admitting: Adult Health

## 2022-06-01 DIAGNOSIS — F331 Major depressive disorder, recurrent, moderate: Secondary | ICD-10-CM

## 2022-06-01 DIAGNOSIS — F411 Generalized anxiety disorder: Secondary | ICD-10-CM

## 2022-06-15 ENCOUNTER — Ambulatory Visit (INDEPENDENT_AMBULATORY_CARE_PROVIDER_SITE_OTHER): Payer: 59 | Admitting: Psychology

## 2022-06-15 DIAGNOSIS — F411 Generalized anxiety disorder: Secondary | ICD-10-CM

## 2022-06-15 DIAGNOSIS — F331 Major depressive disorder, recurrent, moderate: Secondary | ICD-10-CM

## 2022-06-16 NOTE — Progress Notes (Signed)
[facility] Counselor/Therapist Progress Note  Patient ID: Melissa Cowan, MRN: [id number],    Date: 06/15/2022  Time Spent: 50 minutes  Treatment Type: Individual Therapy  Reported Symptoms: depression and anxiety  Mental Status Exam: Appearance:  Casual     Behavior: Appropriate  Motor: Normal  Speech/Language:  Normal Rate  Affect: normal  Mood: normal  Thought process: normal  Thought content:   WNL  Sensory/Perceptual disturbances:   WNL  Orientation: oriented to person, place, time/date, and situation  Attention: Good  Concentration: Good  Memory: WNL  Fund of knowledge:  Good  Insight:   Good  Judgment:  Good  Impulse Control: Good   Risk Assessment: Danger to Self:  No Self-injurious Behavior: No Danger to Others: No Duty to Warn:no Physical Aggression / Violence:No  Access to Firearms a concern: No  Gang Involvement:No   Subjective: The patient attended a face-to-face individual therapy session via video visit today.  The patient gave verbal consent for the video visit to be on WebEx.  The patient was in her home alone and the therapist was in her office.  The patient presents as pleasant and cooperative.  The patient reports that she just got over bronchitis and some sort of illness.  She states that she feels like she has been handling things better emotionally since she had a medication change.  The patient reports that she and her boyfriend seem to be doing a little better and the expectations are more realistic.  The patient seems to be handling things well but would like to continue to do therapy at least every 2 months if not a little more often.  She knows that she can contact me if she needs an appointment sooner. Interventions: Cognitive Behavioral Therapy  Diagnosis:Major depressive disorder, recurrent episode, moderate (HCC)  Generalized anxiety disorder  Plan: Treatment Plan  Strengths/Abilities:  Intelligent, caring, ability for  insight, motivated   Treatment Preferences:  Outpatient Individual therapy  Statement of Needs:  " I need help with depression and anxiety"  Symptoms:  Depressed or irritable mood.: (Status: improved). Diminished interest in or  enjoyment of activities.: (Status: improved). History of chronic or recurrent  depression for which the client has taken antidepressant medication, been hospitalized, had outpatient  treatment, or had a course of electroconvulsive therapy.: (Status: maintained).  Lack of energy.: (Status: improved). Motor tension (e.g., restlessness,  tiredness, shakiness, muscle tension).: (Status: improved). Poor concentration  and indecisiveness.: (Status: improved). Social withdrawal.:  (Status: maintained).   Problems Addressed: Unipolar depression and anxiety  Goals: 1. Alleviate depressive symptoms and return to previous level of effective  Functioning Target Date: 2023-03-21 Frequency: Monthly Progress: 40 Modality: individual 2. Appropriately grieve the loss in order to normalize mood and to return  to previously adaptive level of functioning Target Date: 2023-03-21 Frequency: Monthly Progress: 70 Modality: individual 3. Develop healthy interpersonal relationships that lead to the alleviation  and help prevent the relapse of depression. 4. Develop healthy thinking patterns and beliefs about self, others, and the world that lead to the alleviation and help prevent the relapse of  depression. 5. Recognize, accept, and cope with feelings of depression. 6. Stabilize anxiety level while increasing ability to function on a daily  basis. Objective Learn and implement calming skills to reduce overall anxiety and manage anxiety symptoms. Target Date: 2023-03-21 Frequency: Monthly Progress: 30 Modality: individual    Interventions by Therapist:  CBT, insight oriented approach, grief counseling,  problem solving.  The patient is  making progress and has approved this  plan.  Nichole Neyer G Rayvn Rickerson, LCSW

## 2022-06-19 ENCOUNTER — Other Ambulatory Visit: Payer: Self-pay | Admitting: Adult Health

## 2022-06-19 DIAGNOSIS — F411 Generalized anxiety disorder: Secondary | ICD-10-CM

## 2022-07-22 ENCOUNTER — Encounter: Payer: Self-pay | Admitting: Internal Medicine

## 2022-07-22 ENCOUNTER — Ambulatory Visit (INDEPENDENT_AMBULATORY_CARE_PROVIDER_SITE_OTHER): Payer: 59 | Admitting: Internal Medicine

## 2022-07-22 ENCOUNTER — Other Ambulatory Visit: Payer: Self-pay | Admitting: Internal Medicine

## 2022-07-22 VITALS — BP 124/78 | HR 87 | Temp 96.6°F | Ht 67.0 in | Wt 192.0 lb

## 2022-07-22 DIAGNOSIS — R220 Localized swelling, mass and lump, head: Secondary | ICD-10-CM

## 2022-07-22 DIAGNOSIS — R221 Localized swelling, mass and lump, neck: Secondary | ICD-10-CM

## 2022-07-22 DIAGNOSIS — Z0001 Encounter for general adult medical examination with abnormal findings: Secondary | ICD-10-CM

## 2022-07-22 DIAGNOSIS — E6609 Other obesity due to excess calories: Secondary | ICD-10-CM | POA: Diagnosis not present

## 2022-07-22 DIAGNOSIS — Z6830 Body mass index (BMI) 30.0-30.9, adult: Secondary | ICD-10-CM

## 2022-07-22 DIAGNOSIS — E7800 Pure hypercholesterolemia, unspecified: Secondary | ICD-10-CM

## 2022-07-22 DIAGNOSIS — R7303 Prediabetes: Secondary | ICD-10-CM

## 2022-07-22 DIAGNOSIS — M7711 Lateral epicondylitis, right elbow: Secondary | ICD-10-CM

## 2022-07-22 DIAGNOSIS — E781 Pure hyperglyceridemia: Secondary | ICD-10-CM

## 2022-07-22 NOTE — Assessment & Plan Note (Signed)
Encourage diet and exercise for weight loss 

## 2022-07-22 NOTE — Telephone Encounter (Signed)
Requested Prescriptions  Pending Prescriptions Disp Refills   omeprazole (PRILOSEC) 20 MG capsule [Pharmacy Med Name: OMEPRAZOLE DR 20 MG CAPSULE] 30 capsule 5    Sig: TAKE 1 CAPSULE BY MOUTH EVERY DAY     Gastroenterology: Proton Pump Inhibitors Passed - 07/22/2022  1:30 AM      Passed - Valid encounter within last 12 months    Recent Outpatient Visits           Today Encounter for general adult medical examination with abnormal findings   [facility] [place], Coralie Keens, NP   6 months ago Encounter for screening mammogram for malignant neoplasm of breast   [facility] [place], Coralie Keens, NP   1 year ago Encounter for general adult medical examination with abnormal findings   [facility] [place], Coralie Keens, NP   1 year ago Primary hypertension   [facility] [place], [place] W, NP               atorvastatin (LIPITOR) 10 MG tablet [Pharmacy Med Name: ATORVASTATIN 10 MG TABLET] 90 tablet 1    Sig: TAKE 1 TABLET BY MOUTH EVERY DAY     Cardiovascular:  Antilipid - Statins Failed - 07/22/2022  1:30 AM      Failed - Lipid Panel in normal range within the last 12 months    Cholesterol  Date Value Ref Range Status  01/19/2022 214 (H) <200 mg/dL Final   LDL Cholesterol (Calc)  Date Value Ref Range Status  01/19/2022 105 (H) mg/dL (calc) Final    Comment:    Reference range: <100 . Desirable range <100 mg/dL for primary prevention;   <70 mg/dL for patients with CHD or diabetic patients  with > or = 2 CHD risk factors. Marland Kitchen LDL-C is now calculated using the Martin-Hopkins  calculation, which is a validated novel method providing  better accuracy than the Friedewald equation in the  estimation of LDL-C.  Cresenciano Genre et al. Annamaria Helling. 4259;563(87): 2061-2068  (http://education.[employer].com/faq/FAQ164)    Direct LDL  Date Value Ref Range Status  09/24/2019 112.0 mg/dL Final     Comment:    Optimal:  <100 mg/dLNear or Above Optimal:  100-129 mg/dLBorderline High:  130-159 mg/dLHigh:  160-189 mg/dLVery High:  >190 mg/dL   HDL  Date Value Ref Range Status  01/19/2022 71 > OR = 50 mg/dL Final   Triglycerides  Date Value Ref Range Status  01/19/2022 265 (H) <150 mg/dL Final    Comment:    . If a non-fasting specimen was collected, consider repeat triglyceride testing on a fasting specimen if clinically indicated.  Yates Decamp et al. J. of Clin. Lipidol. 5643;3:295-188. Marland Kitchen          Passed - Patient is not pregnant      Passed - Valid encounter within last 12 months    Recent Outpatient Visits           Today Encounter for general adult medical examination with abnormal findings   [facility] [place], Coralie Keens, NP   6 months ago Encounter for screening mammogram for malignant neoplasm of breast   [facility] [place], Coralie Keens, NP   1 year ago Encounter for general adult medical examination with abnormal findings   [facility] [place], Coralie Keens, NP   1 year ago Primary hypertension   [facility] [place], Coralie Keens, [place]

## 2022-07-22 NOTE — Patient Instructions (Signed)
Health Maintenance for Postmenopausal Women Menopause is a normal process in which your ability to get pregnant comes to an end. This process happens slowly over many months or years, usually between the ages of 48 and 55. Menopause is complete when you have missed your menstrual period for 12 months. It is important to talk with your health care provider about some of the most common conditions that affect women after menopause (postmenopausal women). These include heart disease, cancer, and bone loss (osteoporosis). Adopting a healthy lifestyle and getting preventive care can help to promote your health and wellness. The actions you take can also lower your chances of developing some of these common conditions. What are the signs and symptoms of menopause? During menopause, you may have the following symptoms: Hot flashes. These can be moderate or severe. Night sweats. Decrease in sex drive. Mood swings. Headaches. Tiredness (fatigue). Irritability. Memory problems. Problems falling asleep or staying asleep. Talk with your health care provider about treatment options for your symptoms. Do I need hormone replacement therapy? Hormone replacement therapy is effective in treating symptoms that are caused by menopause, such as hot flashes and night sweats. Hormone replacement carries certain risks, especially as you become older. If you are thinking about using estrogen or estrogen with progestin, discuss the benefits and risks with your health care provider. How can I reduce my risk for heart disease and stroke? The risk of heart disease, heart attack, and stroke increases as you age. One of the causes may be a change in the body's hormones during menopause. This can affect how your body uses dietary fats, triglycerides, and cholesterol. Heart attack and stroke are medical emergencies. There are many things that you can do to help prevent heart disease and stroke. Watch your blood pressure High  blood pressure causes heart disease and increases the risk of stroke. This is more likely to develop in people who have high blood pressure readings or are overweight. Have your blood pressure checked: Every 3-5 years if you are 18-39 years of age. Every year if you are 40 years old or older. Eat a healthy diet  Eat a diet that includes plenty of vegetables, fruits, low-fat dairy products, and lean protein. Do not eat a lot of foods that are high in solid fats, added sugars, or sodium. Get regular exercise Get regular exercise. This is one of the most important things you can do for your health. Most adults should: Try to exercise for at least 150 minutes each week. The exercise should increase your heart rate and make you sweat (moderate-intensity exercise). Try to do strengthening exercises at least twice each week. Do these in addition to the moderate-intensity exercise. Spend less time sitting. Even light physical activity can be beneficial. Other tips Work with your health care provider to achieve or maintain a healthy weight. Do not use any products that contain nicotine or tobacco. These products include cigarettes, chewing tobacco, and vaping devices, such as e-cigarettes. If you need help quitting, ask your health care provider. Know your numbers. Ask your health care provider to check your cholesterol and your blood sugar (glucose). Continue to have your blood tested as directed by your health care provider. Do I need screening for cancer? Depending on your health history and family history, you may need to have cancer screenings at different stages of your life. This may include screening for: Breast cancer. Cervical cancer. Lung cancer. Colorectal cancer. What is my risk for osteoporosis? After menopause, you may be   at increased risk for osteoporosis. Osteoporosis is a condition in which bone destruction happens more quickly than new bone creation. To help prevent osteoporosis or  the bone fractures that can happen because of osteoporosis, you may take the following actions: If you are 19-50 years old, get at least 1,000 mg of calcium and at least 600 international units (IU) of vitamin D per day. If you are older than age 50 but younger than age 70, get at least 1,200 mg of calcium and at least 600 international units (IU) of vitamin D per day. If you are older than age 70, get at least 1,200 mg of calcium and at least 800 international units (IU) of vitamin D per day. Smoking and drinking excessive alcohol increase the risk of osteoporosis. Eat foods that are rich in calcium and vitamin D, and do weight-bearing exercises several times each week as directed by your health care provider. How does menopause affect my mental health? Depression may occur at any age, but it is more common as you become older. Common symptoms of depression include: Feeling depressed. Changes in sleep patterns. Changes in appetite or eating patterns. Feeling an overall lack of motivation or enjoyment of activities that you previously enjoyed. Frequent crying spells. Talk with your health care provider if you think that you are experiencing any of these symptoms. General instructions See your health care provider for regular wellness exams and vaccines. This may include: Scheduling regular health, dental, and eye exams. Getting and maintaining your vaccines. These include: Influenza vaccine. Get this vaccine each year before the flu season begins. Pneumonia vaccine. Shingles vaccine. Tetanus, diphtheria, and pertussis (Tdap) booster vaccine. Your health care provider may also recommend other immunizations. Tell your health care provider if you have ever been abused or do not feel safe at home. Summary Menopause is a normal process in which your ability to get pregnant comes to an end. This condition causes hot flashes, night sweats, decreased interest in sex, mood swings, headaches, or lack  of sleep. Treatment for this condition may include hormone replacement therapy. Take actions to keep yourself healthy, including exercising regularly, eating a healthy diet, watching your weight, and checking your blood pressure and blood sugar levels. Get screened for cancer and depression. Make sure that you are up to date with all your vaccines. This information is not intended to replace advice given to you by your health care provider. Make sure you discuss any questions you have with your health care provider. Document Revised: 11/03/2020 Document Reviewed: 11/03/2020 Elsevier Patient Education  2023 [employer]  

## 2022-07-22 NOTE — Progress Notes (Addendum)
Subjective:    Patient ID: Melissa Cowan, female    DOB: [birth year], 57 y.o.   MRN: [id number]  HPI  Patient presents to clinic today for her annual exam.  Flu: 02/2022 Tetanus: 11/2013 COVID: Pfizer x 3 Shingrix: Never Pap smear: 08/2019 Mammogram: 02/2022 Colon screening: 01/2019 Vision screening: annually Dentist: biannually  Diet: She does eat meat. She consumes fruits and veggies. She does eat fried foods. She drinks mostly water, tea, Dt. Coke. Exercise: None  Review of Systems     Past Medical History:  Diagnosis Date   Allergy    Depression    Endometriosis determined by laparoscopy 1993   GERD (gastroesophageal reflux disease)    Hypertension     Current Outpatient Medications  Medication Sig Dispense Refill   atorvastatin (LIPITOR) 10 MG tablet Take 1 tablet (10 mg total) by mouth daily. 90 tablet 1   Black Cohosh 200 MG CAPS Take by mouth.     buPROPion (WELLBUTRIN XL) 150 MG 24 hr tablet TAKE THREE TABLETS EVERY MORNING. 90 tablet 1   Cholecalciferol (VITAMIN D3) 1.25 MG (50000 UT) TABS Take by mouth.     Cyanocobalamin 2000 MCG/ML SOLN Inject as directed.     FLUoxetine (PROZAC) 20 MG capsule TAKE 2 CAPSULES BY MOUTH EVERY DAY 60 capsule 0   fluticasone (FLONASE) 50 MCG/ACT nasal spray Place 2 sprays into both nostrils daily. 16 g 0   hydrOXYzine (ATARAX) 25 MG tablet 1 TABLET BY MOUTH 3X A DAY AS NEEDED FOR ANXIETY, MAY TAKE EXTRA DOSE ONCE IF NEEDED, MAX 50MG  DOSE 90 tablet 1   ibuprofen (ADVIL) 800 MG tablet Take 800 mg by mouth 3 (three) times daily.     lisinopril (ZESTRIL) 20 MG tablet Take 1 tablet (20 mg total) by mouth daily. 90 tablet 1   MAGNESIUM CITRATE PO Take by mouth.     omeprazole (PRILOSEC) 20 MG capsule Take 1 capsule (20 mg total) by mouth daily. 90 capsule 1   No current facility-administered medications for this visit.    Allergies  Allergen Reactions   Amoxicillin    Amoxicillin-Pot Clavulanate Rash   Tetracycline Rash     Family History  Problem Relation Age of Onset   Alcohol abuse Mother    Drug abuse Mother    Cancer Mother        uterine   Alcohol abuse Father    Diabetes Father    Stroke Maternal Grandmother    Kidney disease Maternal Grandmother    Breast cancer Neg Hx     Social History   Socioeconomic History   Marital status: Divorced    Spouse name: Not on file   Number of children: Not on file   Years of education: Not on file   Highest education level: Not on file  Occupational History   Not on file  Tobacco Use   Smoking status: Former    Types: Cigarettes   Smokeless tobacco: Never   Tobacco comments:    quit x 15 years ago  Vaping Use   Vaping Use: Not on file  Substance and Sexual Activity   Alcohol use: Yes    Alcohol/week: 0.0 standard drinks of alcohol    Comment: social   Drug use: Not Currently    Types: Cocaine   Sexual activity: Not Currently  Other Topics Concern   Not on file  Social History Narrative   Not on file   Social Determinants of Health   Financial Resource Strain:  Not on file  Food Insecurity: Not on file  Transportation Needs: Not on file  Physical Activity: Not on file  Stress: Not on file  Social Connections: Not on file  Intimate Partner Violence: Not on file     Constitutional: Denies fever, malaise, fatigue, headache or abrupt weight changes.  HEENT: Denies eye pain, eye redness, ear pain, ringing in the ears, wax buildup, runny nose, nasal congestion, bloody nose, or sore throat. Respiratory: Denies difficulty breathing, shortness of breath, cough or sputum production.   Cardiovascular: Denies chest pain, chest tightness, palpitations or swelling in the hands or feet.  Gastrointestinal: Denies abdominal pain, bloating, constipation, diarrhea or blood in the stool.  GU: Denies urgency, frequency, pain with urination, burning sensation, blood in urine, odor or discharge. Musculoskeletal: Patient reports right elbow pain.   Denies decrease in range of motion, difficulty with gait, muscle pain or joint swelling.  Skin: Denies redness, rashes, lesions or ulcercations.  Neurological: Denies dizziness, difficulty with memory, difficulty with speech or problems with balance and coordination.  Psych: Patient has a history of anxiety and depression.  Denies SI/HI.  No other specific complaints in a complete review of systems (except as listed in HPI above).  Objective:   Physical Exam  BP 124/78 (BP Location: Left Arm, Patient Position: Sitting, Cuff Size: Normal)   Pulse 87   Temp (!) 96.6 F (35.9 C) (Tympanic)   Ht 5\' 7"  (1.702 m)   Wt 192 lb (87.1 kg)   LMP 06/28/2018   SpO2 99%   BMI 30.07 kg/m   Wt Readings from Last 3 Encounters:  01/19/22 205 lb (93 kg)  07/02/21 207 lb 12.8 oz (94.3 kg)  12/11/20 212 lb (96.2 kg)    General: Appears her stated age, obese in NAD. Skin: Warm, dry and intact.  HEENT: Head: normal shape and size; Eyes: sclera white, no icterus, conjunctiva pink, PERRLA and EOMs intact; Ears: Tm's gray and intact, normal light reflex; Nose: mucosa pink and moist, septum midline; Throat/Mouth: Teeth present, mucosa pink and moist, no exudate, lesions or ulcerations noted.  Neck:  Neck supple, trachea midline.  Enlargement of the left submandibular gland noted.  No thyromegaly present.  Cardiovascular: Normal rate and rhythm. S1,S2 noted.  No murmur, rubs or gallops noted. No JVD or BLE edema. No carotid bruits noted. Pulmonary/Chest: Normal effort and positive vesicular breath sounds. No respiratory distress. No wheezes, rales or ronchi noted.  Abdomen:  Normal bowel sounds.  Musculoskeletal: Normal flexion and extension of the right elbow.  No signs of joint swelling.  Pain with palpation over the right lateral epicondyle.  Strength 5/5 BUE/BLE.  No difficulty with gait.  Neurological: Alert and oriented. Cranial nerves II-XII grossly intact. Coordination normal.  Psychiatric: Mood and  affect normal. Behavior is normal. Judgment and thought content normal.    BMET    Component Value Date/Time   NA 138 01/19/2022 1523   K 4.6 01/19/2022 1523   CL 105 01/19/2022 1523   CO2 24 01/19/2022 1523   GLUCOSE 87 01/19/2022 1523   BUN 18 01/19/2022 1523   CREATININE 0.64 01/19/2022 1523   CALCIUM 9.7 01/19/2022 1523    Lipid Panel     Component Value Date/Time   CHOL 214 (H) 01/19/2022 1523   TRIG 265 (H) 01/19/2022 1523   HDL 71 01/19/2022 1523   CHOLHDL 3.0 01/19/2022 1523   VLDL 44.6 (H) 09/24/2019 1511   LDLCALC 105 (H) 01/19/2022 1523    CBC  Component Value Date/Time   WBC 5.6 01/19/2022 1523   RBC 4.06 01/19/2022 1523   HGB 11.8 01/19/2022 1523   HCT 35.7 01/19/2022 1523   PLT 287 01/19/2022 1523   MCV 87.9 01/19/2022 1523   MCH 29.1 01/19/2022 1523   MCHC 33.1 01/19/2022 1523   RDW 13.1 01/19/2022 1523    Hgb A1C Lab Results  Component Value Date   HGBA1C 5.3 01/19/2022            Assessment & Plan:   Preventative Health Maintenance:  Flu shot UTD Tetanus UTD Encouraged her to get her COVID booster Discussed Shingrix vaccine, she will check coverage with her insurance company and schedule a nurse visit if she would like to have this done Pap smear UTD Mammogram UTD Colon screening UTD Encouraged her to consume a balanced diet and exercise regimen Advised her to see an eye doctor and dentist annually Will check CBC, c-Met, lipid and A1c today  Submandibular Swelling:  She is not having any pain She reports she recently did have some dental work Will monitor symptoms for now  Right Lateral Epicondylitis:  Offered Rx for prednisone which she declines at this time She will sitter going back to [employer] for dry needling  RTC in 6 months, follow-up chronic conditions Webb Silversmith, NP

## 2022-07-23 ENCOUNTER — Encounter: Payer: Self-pay | Admitting: Internal Medicine

## 2022-07-23 LAB — COMPLETE METABOLIC PANEL WITH GFR
AG Ratio: 1.6 (calc) (ref 1.0–2.5)
ALT: 15 U/L (ref 6–29)
AST: 11 U/L (ref 10–35)
Albumin: 4.4 g/dL (ref 3.6–5.1)
Alkaline phosphatase (APISO): 96 U/L (ref 37–153)
BUN: 11 mg/dL (ref 7–25)
CO2: 27 mmol/L (ref 20–32)
Calcium: 9.8 mg/dL (ref 8.6–10.4)
Chloride: 104 mmol/L (ref 98–110)
Creat: 0.72 mg/dL (ref 0.50–1.03)
Globulin: 2.8 g/dL (calc) (ref 1.9–3.7)
Glucose, Bld: 91 mg/dL (ref 65–99)
Potassium: 4.6 mmol/L (ref 3.5–5.3)
Sodium: 140 mmol/L (ref 135–146)
Total Bilirubin: 0.3 mg/dL (ref 0.2–1.2)
Total Protein: 7.2 g/dL (ref 6.1–8.1)
eGFR: 99 mL/min/{1.73_m2} (ref >=60)

## 2022-07-23 LAB — CBC
HCT: 39 % (ref 35.0–45.0)
Hemoglobin: 12.9 g/dL (ref 11.7–15.5)
MCH: 29 pg (ref 27.0–33.0)
MCHC: 33.1 g/dL (ref 32.0–36.0)
MCV: 87.6 fL (ref 80.0–100.0)
MPV: 9.8 fL (ref 7.5–12.5)
Platelets: 382 10*3/uL (ref 140–400)
RBC: 4.45 10*6/uL (ref 3.80–5.10)
RDW: 13.1 % (ref 11.0–15.0)
WBC: 6.1 10*3/uL (ref 3.8–10.8)

## 2022-07-23 LAB — LIPID PANEL
Cholesterol: 185 mg/dL (ref <200)
HDL: 70 mg/dL (ref >=50)
LDL Cholesterol (Calc): 84 mg/dL (calc)
Non-HDL Cholesterol (Calc): 115 mg/dL (calc) (ref <130)
Total CHOL/HDL Ratio: 2.6 (calc) (ref <5.0)
Triglycerides: 213 mg/dL — ABNORMAL HIGH (ref <150)

## 2022-07-23 LAB — HEMOGLOBIN A1C
Hgb A1c MFr Bld: 5.7 % of total Hgb — ABNORMAL HIGH (ref <5.7)
Mean Plasma Glucose: 117 mg/dL
eAG (mmol/L): 6.5 mmol/L

## 2022-07-26 ENCOUNTER — Telehealth (INDEPENDENT_AMBULATORY_CARE_PROVIDER_SITE_OTHER): Payer: 59 | Admitting: Adult Health

## 2022-07-26 ENCOUNTER — Encounter: Payer: Self-pay | Admitting: Adult Health

## 2022-07-26 DIAGNOSIS — F331 Major depressive disorder, recurrent, moderate: Secondary | ICD-10-CM | POA: Diagnosis not present

## 2022-07-26 DIAGNOSIS — F411 Generalized anxiety disorder: Secondary | ICD-10-CM | POA: Diagnosis not present

## 2022-07-26 MED ORDER — FLUOXETINE HCL 20 MG PO CAPS: 40.0000 mg | ORAL_CAPSULE | Freq: Every day | ORAL | 5 refills | Status: DC

## 2022-07-26 MED ORDER — BUPROPION HCL ER (XL) 150 MG PO TB24: ORAL_TABLET | ORAL | 5 refills | Status: DC

## 2022-07-26 NOTE — Progress Notes (Signed)
Melissa Cowan [id number] 24-Oct-1965 57 y.o.  Virtual Visit via Video Note  I connected with pt @ on 07/26/22 at  5:20 PM EST by a video enabled telemedicine application and verified that I am speaking with the correct person using two identifiers.   I discussed the limitations of evaluation and management by telemedicine and the availability of in person appointments. The patient expressed understanding and agreed to proceed.  I discussed the assessment and treatment plan with the patient. The patient was provided an opportunity to ask questions and all were answered. The patient agreed with the plan and demonstrated an understanding of the instructions.   The patient was advised to call back or seek an in-person evaluation if the symptoms worsen or if the condition fails to improve as anticipated.  I provided 10 minutes of non-face-to-face time during this encounter.  The patient was located at home.  The provider was located at [facility].   Dorothyann Gibbs, NP   Subjective:   Patient ID:  Melissa Cowan is a 57 y.o. (DOB 1966/03/05) female.  Chief Complaint: No chief complaint on file.   HPI Lasheena Minardi presents for follow-up of MDD and GAD.  Referred by therapist - Bambi Cottle.  Describes mood today as "ok". Pleasant. Decreased tearfulness. Mood symptoms - denies depression, anxiety, and irritability. Denies worry, rumination, and over thinking. Denies obsessive behaviors. Reports decreased procrastination. Reports decreased brain fog. Stating "I feel like I'm doing ok". Feels like the Wellbutrin XL 450mg  and Prozac 40mg  daily continue to work well. Working with a - 6 years - . Improved interest and motivation. Taking medications as prescribed.  Energy levels improved. Active, exercising - walking. Enjoys some usual interests and activities. Lives alone. Has a boyfriend. Sister in [place]. Parents deceased. Friends local and  supportive. Appetite adequate. Weight loss - 15 pounds - 190 pounds. Sleeps well most nights. Averages 6 hours.  Focus and concentration - "much better". Completing tasks. 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 Denies SI or HI.  Denies AH or VH. Denies self harm.  Denies substance use.  Previous medication trials:  Buspar, Lexapro   Review of Systems:  Review of Systems  Musculoskeletal:  Negative for gait problem.  Neurological:  Negative for tremors.  Psychiatric/Behavioral:         Please refer to HPI    Medications: I have reviewed the patient's current medications.  Current Outpatient Medications  Medication Sig Dispense Refill   atorvastatin (LIPITOR) 10 MG tablet TAKE 1 TABLET BY MOUTH EVERY DAY 90 tablet 1   buPROPion (WELLBUTRIN XL) 150 MG 24 hr tablet TAKE THREE TABLETS EVERY MORNING. 90 tablet 5   Cholecalciferol (VITAMIN D3) 1.25 MG (50000 UT) TABS Take by mouth.     FLUoxetine (PROZAC) 20 MG capsule Take 2 capsules (40 mg total) by mouth daily. 60 capsule 5   fluticasone (FLONASE) 50 MCG/ACT nasal spray Place 2 sprays into both nostrils daily. 16 g 0   hydrOXYzine (ATARAX) 25 MG tablet 1 TABLET BY MOUTH 3X A DAY AS NEEDED FOR ANXIETY, MAY TAKE EXTRA DOSE ONCE IF NEEDED, MAX 50MG  DOSE 90 tablet 1   ibuprofen (ADVIL) 800 MG tablet Take 800 mg by mouth 3 (three) times daily.     lisinopril (ZESTRIL) 20 MG tablet Take 1 tablet (20 mg total) by mouth daily. 90 tablet 1   MAGNESIUM CITRATE PO Take by mouth.     omeprazole (PRILOSEC) 20 MG capsule TAKE 1 CAPSULE  BY MOUTH EVERY DAY 90 capsule 1   No current facility-administered medications for this visit.    Medication Side Effects: None  Allergies:  Allergies  Allergen Reactions   Amoxicillin    Amoxicillin-Pot Clavulanate Rash   Tetracycline Rash    Past Medical History:  Diagnosis Date   Allergy    Depression    Endometriosis determined by laparoscopy 1993   GERD (gastroesophageal reflux  disease)    Hypertension     Family History  Problem Relation Age of Onset   Alcohol abuse Mother    Drug abuse Mother    Uterine cancer Mother    Alcohol abuse Father    Diabetes Father    Healthy Sister    Stroke Maternal Grandmother    Kidney disease Maternal Grandmother    Breast cancer Neg Hx    Colon cancer Neg Hx     Social History   Socioeconomic History   Marital status: Divorced    Spouse name: Not on file   Number of children: Not on file   Years of education: Not on file   Highest education level: Not on file  Occupational History   Not on file  Tobacco Use   Smoking status: Former    Types: Cigarettes   Smokeless tobacco: Never   Tobacco comments:    quit x 15 years ago  Vaping Use   Vaping Use: Not on file  Substance and Sexual Activity   Alcohol use: Yes    Alcohol/week: 0.0 standard drinks of alcohol    Comment: social   Drug use: Not Currently    Types: Cocaine   Sexual activity: Not Currently  Other Topics Concern   Not on file  Social History Narrative   Not on file   Social Determinants of Health   Financial Resource Strain: Not on file  Food Insecurity: Not on file  Transportation Needs: Not on file  Physical Activity: Not on file  Stress: Not on file  Social Connections: Not on file  Intimate Partner Violence: Not on file    Past Medical History, Surgical history, Social history, and Family history were reviewed and updated as appropriate.   Please see review of systems for further details on the patient's review from today.   Objective:   Physical Exam:  LMP 06/28/2018   Physical Exam Constitutional:      General: She is not in acute distress. Musculoskeletal:        General: No deformity.  Neurological:     Mental Status: She is alert and oriented to person, place, and time.     Coordination: Coordination normal.  Psychiatric:        Attention and Perception: Attention and perception normal. She does not perceive  auditory or visual hallucinations.        Mood and Affect: Mood normal. Mood is not anxious or depressed. Affect is not labile, blunt, angry or inappropriate.        Speech: Speech normal.        Behavior: Behavior normal.        Thought Content: Thought content normal. Thought content is not paranoid or delusional. Thought content does not include homicidal or suicidal ideation. Thought content does not include homicidal or suicidal plan.        Cognition and Memory: Cognition and memory normal.        Judgment: Judgment normal.     Comments: Insight intact     Lab Review:  Component Value Date/Time   NA 140 07/22/2022 0839   K 4.6 07/22/2022 0839   CL 104 07/22/2022 0839   CO2 27 07/22/2022 0839   GLUCOSE 91 07/22/2022 0839   BUN 11 07/22/2022 0839   CREATININE 0.72 07/22/2022 0839   CALCIUM 9.8 07/22/2022 0839   PROT 7.2 07/22/2022 0839   ALBUMIN 4.0 09/24/2019 1511   AST 11 07/22/2022 0839   ALT 15 07/22/2022 0839   ALKPHOS 83 09/24/2019 1511   BILITOT 0.3 07/22/2022 0839       Component Value Date/Time   WBC 6.1 07/22/2022 0839   RBC 4.45 07/22/2022 0839   HGB 12.9 07/22/2022 0839   HCT 39.0 07/22/2022 0839   PLT 382 07/22/2022 0839   MCV 87.6 07/22/2022 0839   MCH 29.0 07/22/2022 0839   MCHC 33.1 07/22/2022 0839   RDW 13.1 07/22/2022 0839    No results found for: "POCLITH", "LITHIUM"   No results found for: "PHENYTOIN", "PHENOBARB", "VALPROATE", "CBMZ"   .res Assessment: Plan:    Plan:  PDMP reviewed  Proazc 40mg  daily Wellbutrin XL 450mg  every morning - denies seizure history.   Time spent with patient was 20 minutes. Greater than 50% of face to face time with patient was spent on counseling and coordination of care.    RTC 3 months  Patient advised to contact office with any questions, adverse effects, or acute worsening in signs and symptoms.  Diagnoses and all orders for this visit:  Major depressive disorder, recurrent episode, moderate  (HCC) -     buPROPion (WELLBUTRIN XL) 150 MG 24 hr tablet; TAKE THREE TABLETS EVERY MORNING.  Generalized anxiety disorder -     FLUoxetine (PROZAC) 20 MG capsule; Take 2 capsules (40 mg total) by mouth daily. -     buPROPion (WELLBUTRIN XL) 150 MG 24 hr tablet; TAKE THREE TABLETS EVERY MORNING.     Please see After Visit Summary for patient specific instructions.  Future Appointments  Date Time Provider [place]  08/17/2022  4:00 PM Cottle, Bambi G, LCSW [id number] None    No orders of the defined types were placed in this encounter.     -------------------------------

## 2022-08-17 ENCOUNTER — Ambulatory Visit (INDEPENDENT_AMBULATORY_CARE_PROVIDER_SITE_OTHER): Payer: 59 | Admitting: Psychology

## 2022-08-17 DIAGNOSIS — F411 Generalized anxiety disorder: Secondary | ICD-10-CM | POA: Diagnosis not present

## 2022-08-17 DIAGNOSIS — F330 Major depressive disorder, recurrent, mild: Secondary | ICD-10-CM

## 2022-08-18 NOTE — Progress Notes (Signed)
[place] Counselor/Therapist Progress Note  Patient ID: Melissa Cowan, MRN: [id number],    Date: 08/17/2022  Time Spent: 60 minutes  Treatment Type: Individual Therapy  Reported Symptoms: depression and anxiety  Mental Status Exam: Appearance:  Casual     Behavior: Appropriate  Motor: Normal  Speech/Language:  Normal Rate  Affect: normal  Mood: normal  Thought process: normal  Thought content:   WNL  Sensory/Perceptual disturbances:   WNL  Orientation: oriented to person, place, time/date, and situation  Attention: Good  Concentration: Good  Memory: WNL  Fund of knowledge:  Good  Insight:   Good  Judgment:  Good  Impulse Control: Good   Risk Assessment: Danger to Self:  No Self-injurious Behavior: No Danger to Others: No Duty to Warn:no Physical Aggression / Violence:No  Access to Firearms a concern: No  Gang Involvement:No   Subjective: The patient attended a face-to-face individual therapy session via video visit today.  The patient gave verbal consent for the video visit to be on WebEx.  The patient was in her home alone and the therapist was in her office.  The patient presents as pleasant and cooperative.  The patient reports that things are status quo.  The patient did become tearful when talking about her relationship with Sammy.  The patient apparently is not getting her needs met in this relationship but she is not willing to let it go either.  We talked about her options and that she would have to be willing to let that relationship go if she wanted to try to pursue something else with someone else.  The patient is not ready to make that decision yet and wants to continue to stay the same.  She reports that work is going okay and she continues to live in her apartment.  The patient struggles with being by herself ,but essentially she is by herself even though she is in a relationship.  The patient is aware of her  options.  Interventions: Cognitive Behavioral Therapy  Diagnosis:Generalized anxiety disorder  Major depressive disorder, recurrent, mild (HCC)  Plan: Treatment Plan  Strengths/Abilities:  Intelligent, caring, ability for insight, motivated   Treatment Preferences:  Outpatient Individual therapy  Statement of Needs:  " I need help with depression and anxiety"  Symptoms:  Depressed or irritable mood.: (Status: improved). Diminished interest in or  enjoyment of activities.: (Status: improved). History of chronic or recurrent  depression for which the client has taken antidepressant medication, been hospitalized, had outpatient  treatment, or had a course of electroconvulsive therapy.: (Status: maintained).  Lack of energy.: (Status: improved). Motor tension (e.g., restlessness,  tiredness, shakiness, muscle tension).: (Status: improved). Poor concentration  and indecisiveness.: (Status: improved). Social withdrawal.:  (Status: maintained).   Problems Addressed: Unipolar depression and anxiety  Goals: 1. Alleviate depressive symptoms and return to previous level of effective  Functioning Target Date: 2023-03-21 Frequency: Monthly Progress: 40 Modality: individual 2. Appropriately grieve the loss in order to normalize mood and to return  to previously adaptive level of functioning Target Date: 2023-03-21 Frequency: Monthly Progress: 70 Modality: individual 3. Develop healthy interpersonal relationships that lead to the alleviation  and help prevent the relapse of depression. 4. Develop healthy thinking patterns and beliefs about self, others, and the world that lead to the alleviation and help prevent the relapse of  depression. 5. Recognize, accept, and cope with feelings of depression. 6. Stabilize anxiety level while increasing ability to function on a  daily  basis. Objective Learn and implement calming skills to reduce overall anxiety and manage anxiety symptoms. Target  Date: 2023-03-21 Frequency: Monthly Progress: 30 Modality: individual    Interventions by Therapist:  CBT, insight oriented approach, grief counseling,  problem solving.  The patient is making progress and has approved this plan.  Morgin Halls G Eaden Hettinger, LCSW

## 2022-09-22 ENCOUNTER — Ambulatory Visit: Payer: 59 | Admitting: Psychology

## 2022-10-14 ENCOUNTER — Other Ambulatory Visit: Payer: Self-pay | Admitting: Internal Medicine

## 2022-10-14 NOTE — Telephone Encounter (Signed)
Requested Prescriptions  Pending Prescriptions Disp Refills   lisinopril (ZESTRIL) 20 MG tablet [Pharmacy Med Name: LISINOPRIL 20 MG TABLET] 90 tablet 1    Sig: TAKE 1 TABLET BY MOUTH EVERY DAY     Cardiovascular:  ACE Inhibitors Passed - 10/14/2022  1:40 AM      Passed - Cr in normal range and within 180 days    Creat  Date Value Ref Range Status  07/22/2022 0.72 0.50 - 1.03 mg/dL Final         Passed - K in normal range and within 180 days    Potassium  Date Value Ref Range Status  07/22/2022 4.6 3.5 - 5.3 mmol/L Final         Passed - Patient is not pregnant      Passed - Last BP in normal range    BP Readings from Last 1 Encounters:  07/22/22 124/78         Passed - Valid encounter within last 6 months    Recent Outpatient Visits           2 months ago Encounter for general adult medical examination with abnormal findings   [place] [facility], Salvadore Oxford, NP   8 months ago Encounter for screening mammogram for malignant neoplasm of breast   [place] [facility] [place], Salvadore Oxford, NP   1 year ago Encounter for general adult medical examination with abnormal findings   [place] [facility] [place], Salvadore Oxford, NP   1 year ago Primary hypertension   [place] [facility] [place], Salvadore Oxford, [place]

## 2022-10-19 ENCOUNTER — Telehealth (INDEPENDENT_AMBULATORY_CARE_PROVIDER_SITE_OTHER): Payer: 59 | Admitting: Adult Health

## 2022-10-19 ENCOUNTER — Encounter: Payer: Self-pay | Admitting: Adult Health

## 2022-10-19 DIAGNOSIS — F331 Major depressive disorder, recurrent, moderate: Secondary | ICD-10-CM

## 2022-10-19 DIAGNOSIS — F411 Generalized anxiety disorder: Secondary | ICD-10-CM

## 2022-10-19 DIAGNOSIS — F330 Major depressive disorder, recurrent, mild: Secondary | ICD-10-CM

## 2022-10-19 MED ORDER — FLUOXETINE HCL 20 MG PO CAPS
40.0000 mg | ORAL_CAPSULE | Freq: Every day | ORAL | 5 refills | Status: DC
Start: 2022-10-19 — End: 2023-04-20

## 2022-10-19 MED ORDER — BUPROPION HCL ER (XL) 150 MG PO TB24
ORAL_TABLET | ORAL | 5 refills | Status: DC
Start: 2022-10-19 — End: 2023-04-20

## 2022-10-19 NOTE — Progress Notes (Signed)
Melissa Cowan [id number] 07/22/1965 57 y.o.  Virtual Visit via Video Note  I connected with pt @ on 10/19/22 at  5:20 PM EDT by a video enabled telemedicine application and verified that I am speaking with the correct person using two identifiers.   I discussed the limitations of evaluation and management by telemedicine and the availability of in person appointments. The patient expressed understanding and agreed to proceed.  I discussed the assessment and treatment plan with the patient. The patient was provided an opportunity to ask questions and all were answered. The patient agreed with the plan and demonstrated an understanding of the instructions.   The patient was advised to call back or seek an in-person evaluation if the symptoms worsen or if the condition fails to improve as anticipated.  I provided 10 minutes of non-face-to-face time during this encounter.  The patient was located at home.  The provider was located at [facility].   Dorothyann Gibbs, NP   Subjective:   Patient ID:  Melissa Cowan is a 57 y.o. (DOB 04/13/1966) female.  Chief Complaint: No chief complaint on file.   HPI Ismahan Pankratz presents for follow-up of MDD and GAD.    Referred by therapist - Bambi Cottle.  Describes mood today as "ok". Pleasant. Decreased tearfulness. Mood symptoms - denies depression, anxiety, and irritability. Denies worry, rumination, and over thinking. Denies obsessive behaviors. Denies   procrastination - working on Doctor, hospital. Reports decreased brain fog - "that has lifted". Stating "I'm doing good - 75 to 80%". Feels like the Wellbutrin XL  and Prozac  daily continue to work well. 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 Improved interest and motivation. Taking medications as prescribed.  Energy levels improved. Active, exercising - walking. Enjoys some usual interests and activities. Lives alone. Has a boyfriend. Sister in [place].  Parents deceased. Friends local and supportive. Appetite adequate. Weight stable - 190 pounds. Sleeps well most nights. Averages 6 hours.  Focus and concentration "getting better". Completing tasks. 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  Denies AH or VH. Denies self harm.  Denies substance use.  Previous medication trials:  Buspar, Lexapro    Review of Systems:  Review of Systems  Musculoskeletal:  Negative for gait problem.  Neurological:  Negative for tremors.  Psychiatric/Behavioral:         Please refer to HPI    Medications: I have reviewed the patient's current medications.  Current Outpatient Medications  Medication Sig Dispense Refill   atorvastatin (LIPITOR) 10 MG tablet TAKE 1 TABLET BY MOUTH EVERY DAY 90 tablet 1   buPROPion (WELLBUTRIN XL) 150 MG 24 hr tablet TAKE THREE TABLETS EVERY MORNING. 90 tablet 5   Cholecalciferol (VITAMIN D3) 1.25 MG (50000 UT) TABS Take by mouth.     FLUoxetine (PROZAC) 20 MG capsule Take 2 capsules (40 mg total) by mouth daily. 60 capsule 5   fluticasone (FLONASE) 50 MCG/ACT nasal spray Place 2 sprays into both nostrils daily. 16 g 0   hydrOXYzine (ATARAX) 25 MG tablet 1 TABLET BY MOUTH 3X A DAY AS NEEDED FOR ANXIETY, MAY TAKE EXTRA DOSE ONCE IF NEEDED, MAX  DOSE 90 tablet 1   ibuprofen (ADVIL) 800 MG tablet Take 800 mg by mouth 3 (three) times daily.     lisinopril (ZESTRIL) 20 MG tablet TAKE 1 TABLET BY MOUTH EVERY DAY 90 tablet 1   MAGNESIUM CITRATE PO Take by mouth.  omeprazole (PRILOSEC) 20 MG capsule TAKE 1 CAPSULE BY MOUTH EVERY DAY 90 capsule 1   No current facility-administered medications for this visit.    Medication Side Effects: None  Allergies:  Allergies  Allergen Reactions   Amoxicillin    Amoxicillin-Pot Clavulanate Rash   Tetracycline Rash    Past Medical History:  Diagnosis Date   Allergy    Depression    Endometriosis determined by laparoscopy 1993   GERD  (gastroesophageal reflux disease)    Hypertension     Family History  Problem Relation Age of Onset   Alcohol abuse Mother    Drug abuse Mother    Uterine cancer Mother    Alcohol abuse Father    Diabetes Father    Healthy Sister    Stroke Maternal Grandmother    Kidney disease Maternal Grandmother    Breast cancer Neg Hx    Colon cancer Neg Hx     Social History   Socioeconomic History   Marital status: Divorced    Spouse name: Not on file   Number of children: Not on file   Years of education: Not on file   Highest education level: Not on file  Occupational History   Not on file  Tobacco Use   Smoking status: Former    Types: Cigarettes   Smokeless tobacco: Never   Tobacco comments:    quit x 15 years ago  Vaping Use   Vaping Use: Not on file  Substance and Sexual Activity   Alcohol use: Yes    Alcohol/week: 0.0 standard drinks of alcohol    Comment: social   Drug use: Not Currently    Types: Cocaine   Sexual activity: Not Currently  Other Topics Concern   Not on file  Social History Narrative   Not on file   Social Determinants of Health   Financial Resource Strain: Not on file  Food Insecurity: Not on file  Transportation Needs: Not on file  Physical Activity: Not on file  Stress: Not on file  Social Connections: Not on file  Intimate Partner Violence: Not on file    Past Medical History, Surgical history, Social history, and Family history were reviewed and updated as appropriate.   Please see review of systems for further details on the patient's review from today.   Objective:   Physical Exam:  LMP 06/28/2018   Physical Exam Constitutional:      General: She is not in acute distress. Musculoskeletal:        General: No deformity.  Neurological:     Mental Status: She is alert and oriented to person, place, and time.     Coordination: Coordination normal.  Psychiatric:        Attention and Perception: Attention and perception normal.  She does not perceive auditory or visual hallucinations.        Mood and Affect: Mood normal. Mood is not anxious or depressed. Affect is not labile, blunt, angry or inappropriate.        Speech: Speech normal.        Behavior: Behavior normal.        Thought Content: Thought content normal. Thought content is not paranoid or delusional. Thought content does not include homicidal or suicidal ideation. Thought content does not include homicidal or suicidal plan.        Cognition and Memory: Cognition and memory normal.        Judgment: Judgment normal.     Comments: Insight intact  Lab Review:     Component Value Date/Time   NA 140 07/22/2022 0839   K 4.6 07/22/2022 0839   CL 104 07/22/2022 0839   CO2 27 07/22/2022 0839   GLUCOSE 91 07/22/2022 0839   BUN 11 07/22/2022 0839   CREATININE 0.72 07/22/2022 0839   CALCIUM 9.8 07/22/2022 0839   PROT 7.2 07/22/2022 0839   ALBUMIN 4.0 09/24/2019 1511   AST 11 07/22/2022 0839   ALT 15 07/22/2022 0839   ALKPHOS 83 09/24/2019 1511   BILITOT 0.3 07/22/2022 0839       Component Value Date/Time   WBC 6.1 07/22/2022 0839   RBC 4.45 07/22/2022 0839   HGB 12.9 07/22/2022 0839   HCT 39.0 07/22/2022 0839   PLT 382 07/22/2022 0839   MCV 87.6 07/22/2022 0839   MCH 29.0 07/22/2022 0839   MCHC 33.1 07/22/2022 0839   RDW 13.1 07/22/2022 0839    No results found for: "POCLITH", "LITHIUM"   No results found for: "PHENYTOIN", "PHENOBARB", "VALPROATE", "CBMZ"   .res Assessment: Plan:    Plan:  PDMP reviewed  Proazc 40mg  daily - will plan to decrease dose to 20mg  daily. Wellbutrin XL 450mg  every morning - denies seizure history.   Time spent with patient was 20 minutes. Greater than 50% of face to face time with patient was spent on counseling and coordination of care.    RTC 6 months  Patient advised to contact office with any questions, adverse effects, or acute worsening in signs and symptoms.  Diagnoses and all orders for this  visit:  Major depressive disorder, recurrent, mild  Generalized anxiety disorder -     buPROPion (WELLBUTRIN XL) 150 MG 24 hr tablet; TAKE THREE TABLETS EVERY MORNING. -     FLUoxetine (PROZAC) 20 MG capsule; Take 2 capsules (40 mg total) by mouth daily.  Major depressive disorder, recurrent episode, moderate -     buPROPion (WELLBUTRIN XL) 150 MG 24 hr tablet; TAKE THREE TABLETS EVERY MORNING.     Please see After Visit Summary for patient specific instructions.  No future appointments.   No orders of the defined types were placed in this encounter.     -------------------------------

## 2023-01-23 ENCOUNTER — Other Ambulatory Visit: Payer: Self-pay | Admitting: Internal Medicine

## 2023-01-23 DIAGNOSIS — E781 Pure hyperglyceridemia: Secondary | ICD-10-CM

## 2023-01-25 NOTE — Telephone Encounter (Signed)
Requested Prescriptions  Pending Prescriptions Disp Refills   omeprazole (PRILOSEC) 20 MG capsule [Pharmacy Med Name: OMEPRAZOLE DR 20 MG CAPSULE] 90 capsule 0    Sig: TAKE 1 CAPSULE BY MOUTH EVERY DAY     Gastroenterology: Proton Pump Inhibitors Passed - 01/23/2023  9:35 AM      Passed - Valid encounter within last 12 months    Recent Outpatient Visits           6 months ago Encounter for general adult medical examination with abnormal findings   [place] [facility] [place], Salvadore Oxford, NP   1 year ago Encounter for screening mammogram for malignant neoplasm of breast   [place] [facility] [place], Salvadore Oxford, NP   1 year ago Encounter for general adult medical examination with abnormal findings   [place] [facility], Salvadore Oxford, NP   2 years ago Primary hypertension   [place] [facility] [place], [place] W, NP               atorvastatin (LIPITOR) 10 MG tablet [Pharmacy Med Name: ATORVASTATIN 10 MG TABLET] 90 tablet 0    Sig: TAKE 1 TABLET BY MOUTH EVERY DAY     Cardiovascular:  Antilipid - Statins Failed - 01/23/2023  9:35 AM      Failed - Lipid Panel in normal range within the last 12 months    Cholesterol  Date Value Ref Range Status  07/22/2022 185 <200 mg/dL Final   LDL Cholesterol (Calc)  Date Value Ref Range Status  07/22/2022 84 mg/dL (calc) Final    Comment:    Reference range: <100 . Desirable range <100 mg/dL for primary prevention;   <70 mg/dL for patients with CHD or diabetic patients  with > or = 2 CHD risk factors. Marland Kitchen LDL-C is now calculated using the Martin-Hopkins  calculation, which is a validated novel method providing  better accuracy than the Friedewald equation in the  estimation of LDL-C.  Horald Pollen et al. Lenox Ahr. 1610;960(45): 2061-2068  (http://education.[facility].com/faq/FAQ164)    Direct LDL  Date Value Ref Range Status  09/24/2019 112.0 mg/dL Final     Comment:    Optimal:  <100 mg/dLNear or Above Optimal:  100-129 mg/dLBorderline High:  130-159 mg/dLHigh:  160-189 mg/dLVery High:  >190 mg/dL   HDL  Date Value Ref Range Status  07/22/2022 70 > OR = 50 mg/dL Final   Triglycerides  Date Value Ref Range Status  07/22/2022 213 (H) <150 mg/dL Final    Comment:    . If a non-fasting specimen was collected, consider repeat triglyceride testing on a fasting specimen if clinically indicated.  Perry Mount et al. J. of Clin. Lipidol. 2015;9:129-169. Marland Kitchen          Passed - Patient is not pregnant      Passed - Valid encounter within last 12 months    Recent Outpatient Visits           6 months ago Encounter for general adult medical examination with abnormal findings   [place] [facility] [place], Salvadore Oxford, NP   1 year ago Encounter for screening mammogram for malignant neoplasm of breast   [place] [facility] [place], Salvadore Oxford, NP   1 year ago Encounter for general adult medical examination with abnormal findings   [place] [facility] [place], Salvadore Oxford, NP   2 years ago Primary hypertension  [facility] [place], Salvadore Oxford, [place]

## 2023-04-20 ENCOUNTER — Encounter: Payer: Self-pay | Admitting: Adult Health

## 2023-04-20 ENCOUNTER — Telehealth: Payer: 59 | Admitting: Adult Health

## 2023-04-20 DIAGNOSIS — F411 Generalized anxiety disorder: Secondary | ICD-10-CM

## 2023-04-20 DIAGNOSIS — F331 Major depressive disorder, recurrent, moderate: Secondary | ICD-10-CM

## 2023-04-20 MED ORDER — FLUOXETINE HCL 20 MG PO CAPS
ORAL_CAPSULE | ORAL | 5 refills | Status: AC
Start: 2023-04-20 — End: ?

## 2023-04-20 MED ORDER — BUPROPION HCL ER (XL) 150 MG PO TB24
ORAL_TABLET | ORAL | 5 refills | Status: AC
Start: 2023-04-20 — End: ?

## 2023-04-20 NOTE — Progress Notes (Signed)
Melissa Cowan [id number] [birth year] 57 y.o.  Virtual Visit via Video Note  I connected with pt @ on 04/20/23 at  5:20 PM EDT by a video enabled telemedicine application and verified that I am speaking with the correct person using two identifiers.   I discussed the limitations of evaluation and management by telemedicine and the availability of in person appointments. The patient expressed understanding and agreed to proceed.  I discussed the assessment and treatment plan with the patient. The patient was provided an opportunity to ask questions and all were answered. The patient agreed with the plan and demonstrated an understanding of the instructions.   The patient was advised to call back or seek an in-person evaluation if the symptoms worsen or if the condition fails to improve as anticipated.  I provided 10 minutes of non-face-to-face time during this encounter.  The patient was located at home.  The provider was located at [facility].   Dorothyann Gibbs, NP   Subjective:   Patient ID:  Melissa Cowan is a 57 y.o. (DOB 1966/06/18) female.  Chief Complaint: No chief complaint on file.   HPI Dilia Buske presents for follow-up of MDD and GAD.    Referred by therapist - Bambi Cottle.  Describes mood today as "ok". Pleasant. Decreased tearfulness. Mood symptoms - denies depression, anxiety, and irritability. Denies panic attacks - reports periods of high anxiety. Reports some social anxiety. Denies worry, rumination, and over thinking. Denies obsessive behaviors. Decreased procrastination. Reports decreased brain fog. Stating "I fell like I'm doing ok". Feels like the Wellbutrin XL 450mg  and Prozac 60mg  daily continue to work well. 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 Improved interest and motivation. Taking medications as prescribed.  Energy levels improved. Active, exercising - walking. Enjoys some usual interests and activities. Lives alone. Has a  boyfriend. Sister in [place]. Parents deceased. Friends local and supportive. Appetite adequate. Weight gain - 190 to 195 pounds. Sleep is variable. Averages 6 to 8 hours - broken sleep.  Focus and concentration difficulties "touch and go at work". Completing tasks. 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  Denies AH or VH. Denies self harm.  Denies substance use.  Previous medication trials:  Buspar, Lexapro  Review of Systems:  Review of Systems  Musculoskeletal:  Negative for gait problem.  Neurological:  Negative for tremors.  Psychiatric/Behavioral:         Please refer to HPI    Medications: I have reviewed the patient's current medications.  Current Outpatient Medications  Medication Sig Dispense Refill   atorvastatin (LIPITOR) 10 MG tablet TAKE 1 TABLET BY MOUTH EVERY DAY 90 tablet 0   buPROPion (WELLBUTRIN XL) 150 MG 24 hr tablet TAKE THREE TABLETS EVERY MORNING. 90 tablet 5   Cholecalciferol (VITAMIN D3) 1.25 MG (50000 UT) TABS Take by mouth.     FLUoxetine (PROZAC) 20 MG capsule Take 2 capsules (40 mg total) by mouth daily. 60 capsule 5   fluticasone (FLONASE) 50 MCG/ACT nasal spray Place 2 sprays into both nostrils daily. 16 g 0   hydrOXYzine (ATARAX) 25 MG tablet 1 TABLET BY MOUTH 3X A DAY AS NEEDED FOR ANXIETY, MAY TAKE EXTRA DOSE ONCE IF NEEDED, MAX 50MG  DOSE 90 tablet 1   ibuprofen (ADVIL) 800 MG tablet Take 800 mg by mouth 3 (three) times daily.     lisinopril (ZESTRIL) 20 MG tablet TAKE 1 TABLET BY MOUTH EVERY DAY 90 tablet 1   MAGNESIUM  CITRATE PO Take by mouth.     omeprazole (PRILOSEC) 20 MG capsule TAKE 1 CAPSULE BY MOUTH EVERY DAY 90 capsule 0   No current facility-administered medications for this visit.    Medication Side Effects: None  Allergies:  Allergies  Allergen Reactions   Amoxicillin    Amoxicillin-Pot Clavulanate Rash   Tetracycline Rash    Past Medical History:  Diagnosis Date   Allergy     Depression    Endometriosis determined by laparoscopy 1993   GERD (gastroesophageal reflux disease)    Hypertension     Family History  Problem Relation Age of Onset   Alcohol abuse Mother    Drug abuse Mother    Uterine cancer Mother    Alcohol abuse Father    Diabetes Father    Healthy Sister    Stroke Maternal Grandmother    Kidney disease Maternal Grandmother    Breast cancer Neg Hx    Colon cancer Neg Hx     Social History   Socioeconomic History   Marital status: Divorced    Spouse name: Not on file   Number of children: Not on file   Years of education: Not on file   Highest education level: Not on file  Occupational History   Not on file  Tobacco Use   Smoking status: Former    Types: Cigarettes   Smokeless tobacco: Never   Tobacco comments:    quit x 15 years ago  Vaping Use   Vaping status: Not on file  Substance and Sexual Activity   Alcohol use: Yes    Alcohol/week: 0.0 standard drinks of alcohol    Comment: social   Drug use: Not Currently    Types: Cocaine   Sexual activity: Not Currently  Other Topics Concern   Not on file  Social History Narrative   Not on file   Social Determinants of Health   Financial Resource Strain: Not on file  Food Insecurity: Not on file  Transportation Needs: Not on file  Physical Activity: Not on file  Stress: Not on file  Social Connections: Not on file  Intimate Partner Violence: Not on file    Past Medical History, Surgical history, Social history, and Family history were reviewed and updated as appropriate.   Please see review of systems for further details on the patient's review from today.   Objective:   Physical Exam:  LMP 06/28/2018   Physical Exam Constitutional:      General: She is not in acute distress. Musculoskeletal:        General: No deformity.  Neurological:     Mental Status: She is alert and oriented to person, place, and time.     Coordination: Coordination normal.   Psychiatric:        Attention and Perception: Attention and perception normal. She does not perceive auditory or visual hallucinations.        Mood and Affect: Affect is not labile, blunt, angry or inappropriate.        Speech: Speech normal.        Behavior: Behavior normal.        Thought Content: Thought content normal. Thought content is not paranoid or delusional. Thought content does not include homicidal or suicidal ideation. Thought content does not include homicidal or suicidal plan.        Cognition and Memory: Cognition and memory normal.        Judgment: Judgment normal.     Comments: Insight intact  Lab Review:     Component Value Date/Time   NA 140 07/22/2022 0839   K 4.6 07/22/2022 0839   CL 104 07/22/2022 0839   CO2 27 07/22/2022 0839   GLUCOSE 91 07/22/2022 0839   BUN 11 07/22/2022 0839   CREATININE 0.72 07/22/2022 0839   CALCIUM 9.8 07/22/2022 0839   PROT 7.2 07/22/2022 0839   ALBUMIN 4.0 09/24/2019 1511   AST 11 07/22/2022 0839   ALT 15 07/22/2022 0839   ALKPHOS 83 09/24/2019 1511   BILITOT 0.3 07/22/2022 0839       Component Value Date/Time   WBC 6.1 07/22/2022 0839   RBC 4.45 07/22/2022 0839   HGB 12.9 07/22/2022 0839   HCT 39.0 07/22/2022 0839   PLT 382 07/22/2022 0839   MCV 87.6 07/22/2022 0839   MCH 29.0 07/22/2022 0839   MCHC 33.1 07/22/2022 0839   RDW 13.1 07/22/2022 0839    No results found for: "POCLITH", "LITHIUM"   No results found for: "PHENYTOIN", "PHENOBARB", "VALPROATE", "CBMZ"   .res Assessment: Plan:    Plan:  PDMP reviewed  Proazc 60mg  daily - take 3 - 20mg  daily Wellbutrin XL 450mg  every morning - denies seizure history.   Time spent with patient was 20 minutes. Greater than 50% of face to face time with patient was spent on counseling and coordination of care.    RTC 6 months  Patient advised to contact office with any questions, adverse effects, or acute worsening in signs and symptoms.  There are no  diagnoses linked to this encounter.   Please see After Visit Summary for patient specific instructions.  Future Appointments  Date Time Provider Department Center  04/20/2023  5:20 PM Austyn Perriello, Thereasa Solo, NP [facility] None    No orders of the defined types were placed in this encounter.     -------------------------------

## 2023-04-26 ENCOUNTER — Other Ambulatory Visit: Payer: Self-pay | Admitting: Internal Medicine

## 2023-04-26 DIAGNOSIS — E781 Pure hyperglyceridemia: Secondary | ICD-10-CM

## 2023-04-26 NOTE — Telephone Encounter (Signed)
Requested by interface surescripts. Last labs 07/22/22. Requested Prescriptions  Pending Prescriptions Disp Refills   lisinopril (ZESTRIL) 20 MG tablet [Pharmacy Med Name: LISINOPRIL 20 MG TABLET] 30 tablet 5    Sig: TAKE 1 TABLET BY MOUTH EVERY DAY     Cardiovascular:  ACE Inhibitors Failed - 04/26/2023  2:35 AM      Failed - Cr in normal range and within 180 days    Creat  Date Value Ref Range Status  07/22/2022 0.72 0.50 - 1.03 mg/dL Final         Failed - K in normal range and within 180 days    Potassium  Date Value Ref Range Status  07/22/2022 4.6 3.5 - 5.3 mmol/L Final         Failed - Valid encounter within last 6 months    Recent Outpatient Visits           9 months ago Encounter for general adult medical examination with abnormal findings   [facility] [facility] [place], Salvadore Oxford, NP   1 year ago Encounter for screening mammogram for malignant neoplasm of breast   [place] [facility] [place], Salvadore Oxford, NP   1 year ago Encounter for general adult medical examination with abnormal findings   [place] [facility] [place], Salvadore Oxford, NP   2 years ago Primary hypertension   [place] [facility] [place], [place], [place]              ZOXWRU - Patient is not pregnant      Passed - Last BP in normal range    BP Readings from Last 1 Encounters:  07/22/22 124/78          omeprazole (PRILOSEC) 20 MG capsule [Pharmacy Med Name: OMEPRAZOLE DR 20 MG CAPSULE] 30 capsule 2    Sig: TAKE 1 CAPSULE BY MOUTH EVERY DAY     Gastroenterology: Proton Pump Inhibitors Passed - 04/26/2023  2:35 AM      Passed - Valid encounter within last 12 months    Recent Outpatient Visits           9 months ago Encounter for general adult medical examination with abnormal findings   [place] [facility] [place], Salvadore Oxford, NP   1 year ago Encounter for screening mammogram for malignant neoplasm of breast   [facility], Salvadore Oxford, NP   1 year ago Encounter for general adult medical examination with abnormal findings   [facility] [place], Salvadore Oxford, NP   2 years ago Primary hypertension   [place] [facility] [place], [place] W, NP               atorvastatin (LIPITOR) 10 MG tablet [Pharmacy Med Name: ATORVASTATIN 10 MG TABLET] 30 tablet 2    Sig: TAKE 1 TABLET BY MOUTH EVERY DAY     Cardiovascular:  Antilipid - Statins Failed - 04/26/2023  2:35 AM      Failed - Lipid Panel in normal range within the last 12 months    Cholesterol  Date Value Ref Range Status  07/22/2022 185 <200 mg/dL Final   LDL Cholesterol (Calc)  Date Value Ref Range Status  07/22/2022 84 mg/dL (calc) Final    Comment:    Reference range: <100 . Desirable range <100 mg/dL for primary prevention;   <70 mg/dL for patients with  CHD or diabetic patients  with > or = 2 CHD risk factors. Marland Kitchen LDL-C is now calculated using the Martin-Hopkins  calculation, which is a validated novel method providing  better accuracy than the Friedewald equation in the  estimation of LDL-C.  Horald Pollen et al. Lenox Ahr. 8119;147(82): 2061-2068  (http://education.[facility].com/faq/FAQ164)    Direct LDL  Date Value Ref Range Status  09/24/2019 112.0 mg/dL Final    Comment:    Optimal:  <100 mg/dLNear or Above Optimal:  100-129 mg/dLBorderline High:  130-159 mg/dLHigh:  160-189 mg/dLVery High:  >190 mg/dL   HDL  Date Value Ref Range Status  07/22/2022 70 > OR = 50 mg/dL Final   Triglycerides  Date Value Ref Range Status  07/22/2022 213 (H) <150 mg/dL Final    Comment:    . If a non-fasting specimen was collected, consider repeat triglyceride testing on a fasting specimen if clinically indicated.  Perry Mount et al. J. of Clin. Lipidol. 2015;9:129-169. Marland Kitchen          Passed - Patient is not pregnant      Passed - Valid encounter within last 12 months     Recent Outpatient Visits           9 months ago Encounter for general adult medical examination with abnormal findings   [facility], Salvadore Oxford, NP   1 year ago Encounter for screening mammogram for malignant neoplasm of breast   [place] [facility] [place], Salvadore Oxford, NP   1 year ago Encounter for general adult medical examination with abnormal findings   [place] [facility] [place], Salvadore Oxford, NP   2 years ago Primary hypertension   [place] [facility] [place], Salvadore [place], [place]

## 2023-04-26 NOTE — Telephone Encounter (Signed)
Over due encounter greater/ =  3 months. Called patient to call back and schedule appt for medication refills.

## 2023-04-26 NOTE — Telephone Encounter (Signed)
Requested by interface surescripts. Called patient to schedule appt for medication refills. No answer LVMTCB. Courtesy refill.  Requested Prescriptions  Pending Prescriptions Disp Refills   lisinopril (ZESTRIL) 20 MG tablet [Pharmacy Med Name: LISINOPRIL 20 MG TABLET] 30 tablet 0    Sig: TAKE 1 TABLET BY MOUTH EVERY DAY     Cardiovascular:  ACE Inhibitors Failed - 04/26/2023  2:35 AM      Failed - Cr in normal range and within 180 days    Creat  Date Value Ref Range Status  07/22/2022 0.72 0.50 - 1.03 mg/dL Final         Failed - K in normal range and within 180 days    Potassium  Date Value Ref Range Status  07/22/2022 4.6 3.5 - 5.3 mmol/L Final         Failed - Valid encounter within last 6 months    Recent Outpatient Visits           9 months ago Encounter for general adult medical examination with abnormal findings   [place] [facility] [place], Salvadore Oxford, NP   1 year ago Encounter for screening mammogram for malignant neoplasm of breast   [place] [place], Salvadore Oxford, NP   1 year ago Encounter for general adult medical examination with abnormal findings   [facility] [place], Salvadore Oxford, NP   2 years ago Primary hypertension   [place] [facility] [place], [place], [place]              HYQMVH - Patient is not pregnant      Passed - Last BP in normal range    BP Readings from Last 1 Encounters:  07/22/22 124/78         Signed Prescriptions Disp Refills   omeprazole (PRILOSEC) 20 MG capsule 30 capsule 2    Sig: TAKE 1 CAPSULE BY MOUTH EVERY DAY     Gastroenterology: Proton Pump Inhibitors Passed - 04/26/2023  2:35 AM      Passed - Valid encounter within last 12 months    Recent Outpatient Visits           9 months ago Encounter for general adult medical examination with abnormal findings   [place] [facility] [place], Salvadore Oxford, NP   1 year ago Encounter for  screening mammogram for malignant neoplasm of breast   [place] [facility] [place], Salvadore Oxford, NP   1 year ago Encounter for general adult medical examination with abnormal findings   [place] [facility] [place], Salvadore Oxford, NP   2 years ago Primary hypertension   [place] [facility] [place], [place], NP               atorvastatin (LIPITOR) 10 MG tablet 30 tablet 2    Sig: TAKE 1 TABLET BY MOUTH EVERY DAY     Cardiovascular:  Antilipid - Statins Failed - 04/26/2023  2:35 AM      Failed - Lipid Panel in normal range within the last 12 months    Cholesterol  Date Value Ref Range Status  07/22/2022 185 <200 mg/dL Final   LDL Cholesterol (Calc)  Date Value Ref Range Status  07/22/2022 84 mg/dL (calc) Final    Comment:    Reference range: <100 . Desirable range <100 mg/dL for primary prevention;   <70 mg/dL for patients  with CHD or diabetic patients  with > or = 2 CHD risk factors. Marland Kitchen LDL-C is now calculated using the Martin-Hopkins  calculation, which is a validated novel method providing  better accuracy than the Friedewald equation in the  estimation of LDL-C.  Horald Pollen et al. Lenox Ahr. 1610;960(45): 2061-2068  (http://education.[employer].com/faq/FAQ164)    Direct LDL  Date Value Ref Range Status  09/24/2019 112.0 mg/dL Final    Comment:    Optimal:  <100 mg/dLNear or Above Optimal:  100-129 mg/dLBorderline High:  130-159 mg/dLHigh:  160-189 mg/dLVery High:  >190 mg/dL   HDL  Date Value Ref Range Status  07/22/2022 70 > OR = 50 mg/dL Final   Triglycerides  Date Value Ref Range Status  07/22/2022 213 (H) <150 mg/dL Final    Comment:    . If a non-fasting specimen was collected, consider repeat triglyceride testing on a fasting specimen if clinically indicated.  Perry Mount et al. J. of Clin. Lipidol. 2015;9:129-169. Marland Kitchen          Passed - Patient is not pregnant      Passed - Valid encounter within last  12 months    Recent Outpatient Visits           9 months ago Encounter for general adult medical examination with abnormal findings   [place] [facility] [place], Salvadore Oxford, NP   1 year ago Encounter for screening mammogram for malignant neoplasm of breast   [place] [facility] [place], Salvadore Oxford, NP   1 year ago Encounter for general adult medical examination with abnormal findings   [facility] [facility] [place], Salvadore Oxford, NP   2 years ago Primary hypertension   [place] [facility] [place], Salvadore Oxford, [place]

## 2023-05-01 ENCOUNTER — Other Ambulatory Visit: Payer: Self-pay | Admitting: Internal Medicine

## 2023-05-03 NOTE — Telephone Encounter (Signed)
Requested medication (s) are due for refill today: last signed 04/26/23 courtesy refill  Requested medication (s) are on the active medication list: yes   Last refill:  04/26/23 #30 0 refills   Future visit scheduled: no   Notes to clinic:  called patient to schedule appt for medication refills. No answer, LVMTCB overdue encounter. Do you want to give another courtesy refill?     Requested Prescriptions  Pending Prescriptions Disp Refills   lisinopril (ZESTRIL) 20 MG tablet [Pharmacy Med Name: LISINOPRIL 20 MG TABLET] 30 tablet 0    Sig: TAKE 1 TABLET BY MOUTH EVERY DAY     Cardiovascular:  ACE Inhibitors Failed - 05/01/2023  6:30 PM      Failed - Cr in normal range and within 180 days    Creat  Date Value Ref Range Status  07/22/2022 0.72 0.50 - 1.03 mg/dL Final         Failed - K in normal range and within 180 days    Potassium  Date Value Ref Range Status  07/22/2022 4.6 3.5 - 5.3 mmol/L Final         Failed - Valid encounter within last 6 months    Recent Outpatient Visits           9 months ago Encounter for general adult medical examination with abnormal findings   [facility] [place], Salvadore Oxford, NP   1 year ago Encounter for screening mammogram for malignant neoplasm of breast   [place] [facility] [place], Salvadore Oxford, NP   1 year ago Encounter for general adult medical examination with abnormal findings   [place] [place], Salvadore Oxford, NP   2 years ago Primary hypertension   [place] [facility] [place], [place], [place]              OVFIEP - Patient is not pregnant      Passed - Last BP in normal range    BP Readings from Last 1 Encounters:  07/22/22 124/78

## 2023-05-03 NOTE — Telephone Encounter (Signed)
Called patient to schedule future visit for medication refills. No answer, LVMTCB

## 2023-05-28 ENCOUNTER — Other Ambulatory Visit: Payer: Self-pay | Admitting: Internal Medicine

## 2023-05-31 NOTE — Telephone Encounter (Signed)
Requested medication (s) are due for refill today: yes  Requested medication (s) are on the active medication list: yes  Last refill:  04/26/23 #30  Future visit scheduled:no  Notes to clinic:  Sent MyChart message to pt make appt /overdue lab   Requested Prescriptions  Pending Prescriptions Disp Refills   lisinopril (ZESTRIL) 20 MG tablet [Pharmacy Med Name: LISINOPRIL 20 MG TABLET] 30 tablet 0    Sig: TAKE 1 TABLET BY MOUTH EVERY DAY     Cardiovascular:  ACE Inhibitors Failed - 05/28/2023  8:28 AM      Failed - Cr in normal range and within 180 days    Creat  Date Value Ref Range Status  07/22/2022 0.72 0.50 - 1.03 mg/dL Final         Failed - K in normal range and within 180 days    Potassium  Date Value Ref Range Status  07/22/2022 4.6 3.5 - 5.3 mmol/L Final         Failed - Valid encounter within last 6 months    Recent Outpatient Visits           10 months ago Encounter for general adult medical examination with abnormal findings   [place] [place], Salvadore Oxford, NP   1 year ago Encounter for screening mammogram for malignant neoplasm of breast   [place] [facility] [place], Salvadore Oxford, NP   1 year ago Encounter for general adult medical examination with abnormal findings   [place] [facility] [place], Salvadore Oxford, NP   2 years ago Primary hypertension   [place] [facility] [place], [place], [place]              Passed - Patient is not pregnant      Passed - Last BP in normal range    BP Readings from Last 1 Encounters:  07/22/22 124/78

## 2023-06-09 ENCOUNTER — Ambulatory Visit: Payer: 59 | Admitting: Internal Medicine

## 2023-07-04 ENCOUNTER — Ambulatory Visit (INDEPENDENT_AMBULATORY_CARE_PROVIDER_SITE_OTHER): Payer: 59 | Admitting: Internal Medicine

## 2023-07-04 ENCOUNTER — Encounter: Payer: Self-pay | Admitting: Internal Medicine

## 2023-07-04 VITALS — BP 132/88 | Ht 67.0 in | Wt 200.8 lb

## 2023-07-04 DIAGNOSIS — D649 Anemia, unspecified: Secondary | ICD-10-CM

## 2023-07-04 DIAGNOSIS — I10 Essential (primary) hypertension: Secondary | ICD-10-CM | POA: Diagnosis not present

## 2023-07-04 DIAGNOSIS — R7303 Prediabetes: Secondary | ICD-10-CM | POA: Diagnosis not present

## 2023-07-04 DIAGNOSIS — K219 Gastro-esophageal reflux disease without esophagitis: Secondary | ICD-10-CM | POA: Diagnosis not present

## 2023-07-04 DIAGNOSIS — B002 Herpesviral gingivostomatitis and pharyngotonsillitis: Secondary | ICD-10-CM

## 2023-07-04 DIAGNOSIS — E7800 Pure hypercholesterolemia, unspecified: Secondary | ICD-10-CM

## 2023-07-04 DIAGNOSIS — M170 Bilateral primary osteoarthritis of knee: Secondary | ICD-10-CM

## 2023-07-04 DIAGNOSIS — F32A Depression, unspecified: Secondary | ICD-10-CM

## 2023-07-04 DIAGNOSIS — E6609 Other obesity due to excess calories: Secondary | ICD-10-CM

## 2023-07-04 DIAGNOSIS — E781 Pure hyperglyceridemia: Secondary | ICD-10-CM | POA: Diagnosis not present

## 2023-07-04 DIAGNOSIS — Z6831 Body mass index (BMI) 31.0-31.9, adult: Secondary | ICD-10-CM

## 2023-07-04 DIAGNOSIS — E66811 Obesity, class 1: Secondary | ICD-10-CM

## 2023-07-04 DIAGNOSIS — F419 Anxiety disorder, unspecified: Secondary | ICD-10-CM

## 2023-07-04 MED ORDER — LISINOPRIL 20 MG PO TABS: 20.0000 mg | ORAL_TABLET | Freq: Every day | ORAL | 1 refills | Status: DC

## 2023-07-04 MED ORDER — ATORVASTATIN CALCIUM 10 MG PO TABS: 10.0000 mg | ORAL_TABLET | Freq: Every day | ORAL | 1 refills | Status: DC

## 2023-07-04 NOTE — Assessment & Plan Note (Signed)
 A1c today Encourage low-carb diet and exercise for weight loss

## 2023-07-04 NOTE — Assessment & Plan Note (Signed)
Not currently treated We will monitor

## 2023-07-04 NOTE — Assessment & Plan Note (Signed)
 CBC today.

## 2023-07-04 NOTE — Assessment & Plan Note (Signed)
 No complaints of pain Encourage weight loss as this can help reduce joint pain

## 2023-07-04 NOTE — Progress Notes (Signed)
 Subjective:    Patient ID: Melissa Cowan, female    DOB: 14-Jul-1965, 59 y.o.   MRN: [id number]  HPI  Patient presents to clinic today for follow-up of chronic conditions.  HTN: Her BP today is 132/88.  She is taking lisinopril  as prescribed.  ECG from 12/2014 reviewed.  Cold sores: Intermittent.  She is not taking any antiviral medication at this time.  GERD: Triggered by caffeine, tomato-based foods.  She takes omeprazole  as needed.  There is no upper GI on file.  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  She is not currently taking any medications for this.  She does not follow with orthopedics.  Anxiety and depression: Chronic, managed on fluoxetine , bupropion  but is not longer taking hydroxyzine .  She is seeing a therapist and a psychiatrist.  She denies SI/HI.  HLD: Her last LDL was 84, triglycerides 786,/7978.  She denies myalgias on atorvastatin .  She tries to consume low-fat diet.  Prediabetes: Her last A1c was 5.7%, 06/2022.  She is not taking any oral diabetic medication at this time.  She does not check her sugars.  Anemia: Her last H/H was 12.9/39, 06/2022.  She is not taking any oral iron at this time.  She does not follow with hematology.  Review of Systems     Past Medical History:  Diagnosis Date   Allergy    Depression    Endometriosis determined by laparoscopy 1993   GERD (gastroesophageal reflux disease)    Hypertension     Current Outpatient Medications  Medication Sig Dispense Refill   atorvastatin  (LIPITOR) 10 MG tablet TAKE 1 TABLET BY MOUTH EVERY DAY 30 tablet 2   buPROPion  (WELLBUTRIN  XL) 150 MG 24 hr tablet TAKE THREE TABLETS EVERY MORNING. 90 tablet 5   Cholecalciferol (VITAMIN D3) 1.25 MG (50000 UT) TABS Take by mouth.     FLUoxetine  (PROZAC ) 20 MG capsule Take three capsules every day. 90 capsule 5   fluticasone  (FLONASE ) 50 MCG/ACT nasal spray Place 2 sprays into both nostrils daily. 16 g 0   hydrOXYzine  (ATARAX ) 25 MG tablet 1 TABLET BY MOUTH 3X A DAY AS  NEEDED FOR ANXIETY, MAY TAKE EXTRA DOSE ONCE IF NEEDED, MAX 50MG  DOSE 90 tablet 1   ibuprofen (ADVIL) 800 MG tablet Take 800 mg by mouth 3 (three) times daily.     lisinopril  (ZESTRIL ) 20 MG tablet TAKE 1 TABLET BY MOUTH EVERY DAY 30 tablet 0   MAGNESIUM CITRATE PO Take by mouth.     omeprazole  (PRILOSEC) 20 MG capsule TAKE 1 CAPSULE BY MOUTH EVERY DAY 30 capsule 2   No current facility-administered medications for this visit.    Allergies  Allergen Reactions   Amoxicillin    Amoxicillin-Pot Clavulanate Rash   Tetracycline Rash    Family History  Problem Relation Age of Onset   Alcohol abuse Mother    Drug abuse Mother    Uterine cancer Mother    Alcohol abuse Father    Diabetes Father    Healthy Sister    Stroke Maternal Grandmother    Kidney disease Maternal Grandmother    Breast cancer Neg Hx    Colon cancer Neg Hx     Social History   Socioeconomic History   Marital status: Divorced    Spouse name: Not on file   Number of children: Not on file   Years of education: Not on file   Highest education level: Some college, no degree  Occupational History   Not on file  Tobacco Use   Smoking status: Former    Types: Cigarettes   Smokeless tobacco: Never   Tobacco comments:    quit x 15 years ago  Vaping Use   Vaping status: Not on file  Substance and Sexual Activity   Alcohol use: Yes    Alcohol/week: 0.0 standard drinks of alcohol    Comment: social   Drug use: Not Currently    Types: Cocaine   Sexual activity: Not Currently  Other Topics Concern   Not on file  Social History Narrative   Not on file   Social Drivers of Health   Financial Resource Strain: Low Risk  (07/01/2023)   Overall Financial Resource Strain (CARDIA)    Difficulty of Paying Living Expenses: Not very hard  Food Insecurity: No Food Insecurity (07/01/2023)   Hunger Vital Sign    Worried About Running Out of Food in the Last Year: Never true    Ran Out of Food in the Last Year: Never true   Transportation Needs: No Transportation Needs (07/01/2023)   PRAPARE - Administrator, Civil Service (Medical): No    Lack of Transportation (Non-Medical): No  Physical Activity: Unknown (07/01/2023)   Exercise Vital Sign    Days of Exercise per Week: 0 days    Minutes of Exercise per Session: Not on file  Stress: Stress Concern Present (07/01/2023)   [employer] of Occupational Health - Occupational Stress Questionnaire    Feeling of Stress : To some extent  Social Connections: Socially Isolated (07/01/2023)   Social Connection and Isolation Panel [NHANES]    Frequency of Communication with Friends and Family: Once a week    Frequency of Social Gatherings with Friends and Family: Once a week    Attends Religious Services: Never    Database Administrator or Organizations: No    Attends Engineer, Structural: Not on file    Marital Status: Divorced  Intimate Partner Violence: Not on file     Constitutional: Denies fever, malaise, fatigue, headache or abrupt weight changes.  HEENT: Denies eye pain, eye redness, ear pain, ringing in the ears, wax buildup, runny nose, nasal congestion, bloody nose, or sore throat. Respiratory: Denies difficulty breathing, shortness of breath, cough or sputum production.   Cardiovascular: Denies chest pain, chest tightness, palpitations or swelling in the hands or feet.  Gastrointestinal: Denies abdominal pain, bloating, constipation, diarrhea or blood in the stool.  GU: Denies urgency, frequency, pain with urination, burning sensation, blood in urine, odor or discharge. Musculoskeletal: Pt reports left wrist pain. Denies decrease in range of motion, difficulty with gait, muscle pain or joint swelling.  Skin: Denies redness, rashes, lesions or ulcercations.  Neurological: Denies dizziness, difficulty with memory, difficulty with speech or problems with balance and coordination.  Psych: Patient has a history of anxiety and depression.   Denies SI/HI.  No other specific complaints in a complete review of systems (except as listed in HPI above).  Objective:   Physical Exam BP 132/88 (BP Location: Left Arm, Patient Position: Sitting, Cuff Size: Large)   Ht 5' 7 (1.702 m)   Wt 200 lb 12.8 oz (91.1 kg)   LMP 06/28/2018   BMI 31.45 kg/m   Wt Readings from Last 3 Encounters:  07/22/22 192 lb (87.1 kg)  01/19/22 205 lb (93 kg)  07/02/21 207 lb 12.8 oz (94.3 kg)    General: Appears her stated age, obese, in NAD. Skin: Warm, dry and intact.  Neck:  Neck  supple, trachea midline.  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 S1,S2 noted.  No murmur, rubs or gallops noted. No JVD or BLE edema. No carotid bruits noted. Pulmonary/Chest: Normal effort and positive vesicular breath sounds. No respiratory distress. No wheezes, rales or ronchi noted.  Musculoskeletal: Strength 5/5 BUE/BLE. No difficulty with gait.  Neurological: Alert and oriented. Coordination normal.  Psychiatric: Mood and affect normal. Behavior is normal. Judgment and thought content normal.     BMET    Component Value Date/Time   NA 140 07/22/2022 0839   K 4.6 07/22/2022 0839   CL 104 07/22/2022 0839   CO2 27 07/22/2022 0839   GLUCOSE 91 07/22/2022 0839   BUN 11 07/22/2022 0839   CREATININE 0.72 07/22/2022 0839   CALCIUM  9.8 07/22/2022 0839    Lipid Panel     Component Value Date/Time   CHOL 185 07/22/2022 0839   TRIG 213 (H) 07/22/2022 0839   HDL 70 07/22/2022 0839   CHOLHDL 2.6 07/22/2022 0839   VLDL 44.6 (H) 09/24/2019 1511   LDLCALC 84 07/22/2022 0839    CBC    Component Value Date/Time   WBC 6.1 07/22/2022 0839   RBC 4.45 07/22/2022 0839   HGB 12.9 07/22/2022 0839   HCT 39.0 07/22/2022 0839   PLT 382 07/22/2022 0839   MCV 87.6 07/22/2022 0839   MCH 29.0 07/22/2022 0839   MCHC 33.1 07/22/2022 0839   RDW 13.1 07/22/2022 0839    Hgb A1C Lab  Results  Component Value Date   HGBA1C 5.7 (H) 07/22/2022            Assessment & Plan:     RTC in 6 months for your annual exam Angeline Laura, NP

## 2023-07-04 NOTE — Assessment & Plan Note (Signed)
Controlled on lisinopril, refilled today C-Met today Reinforced DASH diet and exercise for weight loss

## 2023-07-04 NOTE — Assessment & Plan Note (Signed)
 Continue fluoxetine, bupropion  Will discontinue hydroxyzine due to nonuse She will continue meet with her therapist and psychiatrist Support offered

## 2023-07-04 NOTE — Assessment & Plan Note (Signed)
 Encourage diet and exercise for weight loss

## 2023-07-04 NOTE — Patient Instructions (Signed)
Prediabetes Eating Plan Prediabetes is a condition that causes blood sugar (glucose) levels to be higher than normal. This increases the risk for developing type 2 diabetes (type 2 diabetes mellitus). Working with a health care provider or nutrition specialist (dietitian) to make diet and lifestyle changes can help prevent the onset of diabetes. These changes may help you: Control your blood glucose levels. Improve your cholesterol levels. Manage your blood pressure. What are tips for following this plan? Reading food labels Read food labels to check the amount of fat, salt (sodium), and sugar in prepackaged foods. Avoid foods that have: Saturated fats. Trans fats. Added sugars. Avoid foods that have more than 300 milligrams (mg) of sodium per serving. Limit your sodium intake to less than 2,300 mg each day. Shopping Avoid buying pre-made and processed foods. Avoid buying drinks with added sugar. Cooking Cook with olive oil. Do not use butter, lard, or ghee. Bake, broil, grill, steam, or boil foods. Avoid frying. Meal planning  Work with your dietitian to create an eating plan that is right for you. This may include tracking how many calories you take in each day. Use a food diary, notebook, or mobile application to track what you eat at each meal. Consider following a Mediterranean diet. This includes: Eating several servings of fresh fruits and vegetables each day. Eating fish at least twice a week. Eating one serving each day of whole grains, beans, nuts, and seeds. Using olive oil instead of other fats. Limiting alcohol. Limiting red meat. Using nonfat or low-fat dairy products. Consider following a plant-based diet. This includes dietary choices that focus on eating mostly vegetables and fruit, grains, beans, nuts, and seeds. If you have high blood pressure, you may need to limit your sodium intake or follow a diet such as the DASH (Dietary Approaches to Stop Hypertension) eating  plan. The DASH diet aims to lower high blood pressure. Lifestyle Set weight loss goals with help from your health care team. It is recommended that most people with prediabetes lose 7% of their body weight. Exercise for at least 30 minutes 5 or more days a week. Attend a support group or seek support from a mental health counselor. Take over-the-counter and prescription medicines only as told by your health care provider. What foods are recommended? Fruits Berries. Bananas. Apples. Oranges. Grapes. Papaya. Mango. Pomegranate. Kiwi. Grapefruit. Cherries. Vegetables Lettuce. Spinach. Peas. Beets. Cauliflower. Cabbage. Broccoli. Carrots. Tomatoes. Squash. Eggplant. Herbs. Peppers. Onions. Cucumbers. Brussels sprouts. Grains Whole grains, such as whole-wheat or whole-grain breads, crackers, cereals, and pasta. Unsweetened oatmeal. Bulgur. Barley. Quinoa. Brown rice. Corn or whole-wheat flour tortillas or taco shells. Meats and other proteins Seafood. Poultry without skin. Lean cuts of pork and beef. Tofu. Eggs. Nuts. Beans. Dairy Low-fat or fat-free dairy products, such as yogurt, cottage cheese, and cheese. Beverages Water. Tea. Coffee. Sugar-free or diet soda. Seltzer water. Low-fat or nonfat milk. Milk alternatives, such as soy or almond milk. Fats and oils Olive oil. Canola oil. Sunflower oil. Grapeseed oil. Avocado. Walnuts. Sweets and desserts Sugar-free or low-fat pudding. Sugar-free or low-fat ice cream and other frozen treats. Seasonings and condiments Herbs. Sodium-free spices. Mustard. Relish. Low-salt, low-sugar ketchup. Low-salt, low-sugar barbecue sauce. Low-fat or fat-free mayonnaise. The items listed above may not be a complete list of recommended foods and beverages. Contact a dietitian for more information. What foods are not recommended? Fruits Fruits canned with syrup. Vegetables Canned vegetables. Frozen vegetables with butter or cream sauce. Grains Refined white  flour and flour   products, such as bread, pasta, snack foods, and cereals. Meats and other proteins Fatty cuts of meat. Poultry with skin. Breaded or fried meat. Processed meats. Dairy Full-fat yogurt, cheese, or milk. Beverages Sweetened drinks, such as iced tea and soda. Fats and oils Butter. Lard. Ghee. Sweets and desserts Baked goods, such as cake, cupcakes, pastries, cookies, and cheesecake. Seasonings and condiments Spice mixes with added salt. Ketchup. Barbecue sauce. Mayonnaise. The items listed above may not be a complete list of foods and beverages that are not recommended. Contact a dietitian for more information. Where to find more information American Diabetes Association: www.diabetes.org Summary You may need to make diet and lifestyle changes to help prevent the onset of diabetes. These changes can help you control blood sugar, improve cholesterol levels, and manage blood pressure. Set weight loss goals with help from your health care team. It is recommended that most people with prediabetes lose 7% of their body weight. Consider following a Mediterranean diet. This includes eating plenty of fresh fruits and vegetables, whole grains, beans, nuts, seeds, fish, and low-fat dairy, and using olive oil instead of other fats. This information is not intended to replace advice given to you by your health care provider. Make sure you discuss any questions you have with your health care provider. Document Revised: 09/13/2019 Document Reviewed: 09/13/2019 Elsevier Patient Education  2024 Elsevier Inc.  

## 2023-07-04 NOTE — Assessment & Plan Note (Signed)
 Avoid foods that trigger reflux Encourage weight loss as this can help reduce reflux symptoms Continue omeprazole as needed

## 2023-07-04 NOTE — Assessment & Plan Note (Signed)
 C-Met and lipid profile today Continue atorvastatin Encouraged her to consume a low-fat diet

## 2023-07-05 ENCOUNTER — Encounter: Payer: Self-pay | Admitting: Internal Medicine

## 2023-07-05 LAB — COMPLETE METABOLIC PANEL WITH GFR
AG Ratio: 1.6 (calc) (ref 1.0–2.5)
ALT: 24 U/L (ref 6–29)
AST: 19 U/L (ref 10–35)
Albumin: 4.4 g/dL (ref 3.6–5.1)
Alkaline phosphatase (APISO): 94 U/L (ref 37–153)
BUN: 13 mg/dL (ref 7–25)
CO2: 24 mmol/L (ref 20–32)
Calcium: 9.6 mg/dL (ref 8.6–10.4)
Chloride: 105 mmol/L (ref 98–110)
Creat: 0.73 mg/dL (ref 0.50–1.03)
Globulin: 2.8 g/dL (ref 1.9–3.7)
Glucose, Bld: 82 mg/dL (ref 65–99)
Potassium: 4.7 mmol/L (ref 3.5–5.3)
Sodium: 140 mmol/L (ref 135–146)
Total Bilirubin: 0.3 mg/dL (ref 0.2–1.2)
Total Protein: 7.2 g/dL (ref 6.1–8.1)
eGFR: 97 mL/min/{1.73_m2} (ref >=60)

## 2023-07-05 LAB — CBC
HCT: 38.2 % (ref 35.0–45.0)
Hemoglobin: 12.5 g/dL (ref 11.7–15.5)
MCH: 28.2 pg (ref 27.0–33.0)
MCHC: 32.7 g/dL (ref 32.0–36.0)
MCV: 86.2 fL (ref 80.0–100.0)
MPV: 9.8 fL (ref 7.5–12.5)
Platelets: 353 10*3/uL (ref 140–400)
RBC: 4.43 10*6/uL (ref 3.80–5.10)
RDW: 13.1 % (ref 11.0–15.0)
WBC: 6.2 10*3/uL (ref 3.8–10.8)

## 2023-07-05 LAB — LIPID PANEL
Cholesterol: 211 mg/dL — ABNORMAL HIGH (ref <200)
HDL: 82 mg/dL (ref >=50)
LDL Cholesterol (Calc): 101 mg/dL — ABNORMAL HIGH
Non-HDL Cholesterol (Calc): 129 mg/dL (ref <130)
Total CHOL/HDL Ratio: 2.6 (calc) (ref <5.0)
Triglycerides: 187 mg/dL — ABNORMAL HIGH (ref <150)

## 2023-07-05 LAB — HEMOGLOBIN A1C
Hgb A1c MFr Bld: 5.7 %{Hb} — ABNORMAL HIGH (ref <5.7)
Mean Plasma Glucose: 117 mg/dL
eAG (mmol/L): 6.5 mmol/L

## 2023-07-05 MED ORDER — ATORVASTATIN CALCIUM 20 MG PO TABS: 20.0000 mg | ORAL_TABLET | Freq: Every day | ORAL | 1 refills | Status: DC

## 2024-01-04 ENCOUNTER — Ambulatory Visit: Payer: Self-pay | Admitting: Internal Medicine

## 2024-01-09 ENCOUNTER — Other Ambulatory Visit: Payer: Self-pay | Admitting: Internal Medicine

## 2024-01-11 NOTE — Telephone Encounter (Signed)
 Last OV 07/05/23 within protocol.  Requested Prescriptions  Pending Prescriptions Disp Refills   atorvastatin  (LIPITOR) 20 MG tablet [Pharmacy Med Name: ATORVASTATIN  20 MG TABLET] 90 tablet 0    Sig: TAKE 1 TABLET BY MOUTH EVERY DAY     Cardiovascular:  Antilipid - Statins Failed - 01/11/2024  8:44 AM      Failed - Valid encounter within last 12 months    Recent Outpatient Visits   None     Future Appointments             In 1 week Antonette Angeline ORN, NP [place] [facility]            Failed - Lipid Panel in normal range within the last 12 months    Cholesterol  Date Value Ref Range Status  07/04/2023 211 (H) <200 mg/dL Final   LDL Cholesterol (Calc)  Date Value Ref Range Status  07/04/2023 101 (H) mg/dL (calc) Final    Comment:    Reference range: <100 . Desirable range <100 mg/dL for primary prevention;   <70 mg/dL for patients with CHD or diabetic patients  with > or = 2 CHD risk factors. SABRA LDL-C is now calculated using the Martin-Hopkins  calculation, which is a validated novel method providing  better accuracy than the Friedewald equation in the  estimation of LDL-C.  Gladis APPLETHWAITE et al. SANDREA. 7986;689(80): 2061-2068  (http://education.[facility].com/faq/FAQ164)    Direct LDL  Date Value Ref Range Status  09/24/2019 112.0 mg/dL Final    Comment:    Optimal:  <100 mg/dLNear or Above Optimal:  100-129 mg/dLBorderline High:  130-159 mg/dLHigh:  160-189 mg/dLVery High:  >190 mg/dL   HDL  Date Value Ref Range Status  07/04/2023 82 > OR = 50 mg/dL Final   Triglycerides  Date Value Ref Range Status  07/04/2023 187 (H) <150 mg/dL Final         Passed - Patient is not pregnant       m

## 2024-01-13 ENCOUNTER — Other Ambulatory Visit: Payer: Self-pay | Admitting: Internal Medicine

## 2024-01-13 NOTE — Telephone Encounter (Signed)
 Requested by interface surescripts. Courtesy refill. Future visit in 5 days.  Requested Prescriptions  Pending Prescriptions Disp Refills   lisinopril  (ZESTRIL ) 20 MG tablet [Pharmacy Med Name: LISINOPRIL  20 MG TABLET] 30 tablet 0    Sig: TAKE 1 TABLET BY MOUTH EVERY DAY     Cardiovascular:  ACE Inhibitors Failed - 01/13/2024  4:29 PM      Failed - Cr in normal range and within 180 days    Creat  Date Value Ref Range Status  07/04/2023 0.73 0.50 - 1.03 mg/dL Final         Failed - K in normal range and within 180 days    Potassium  Date Value Ref Range Status  07/04/2023 4.7 3.5 - 5.3 mmol/L Final         Failed - Valid encounter within last 6 months    Recent Outpatient Visits   None     Future Appointments             In 5 days Baity, Angeline ORN, NP [place] [facility]            Passed - Patient is not pregnant      Passed - Last BP in normal range    BP Readings from Last 1 Encounters:  07/04/23 132/88

## 2024-01-18 ENCOUNTER — Encounter: Admitting: Internal Medicine

## 2024-01-18 NOTE — Progress Notes (Deleted)
 Subjective:    Patient ID: Melissa Cowan, female    DOB: [birth year], 58 y.o.   MRN: [id number]  HPI  Patient presents to clinic today for her annual exam.  Flu: 02/2022 Tetanus: 11/2013 COVID: Pfizer x 3 Shingrix: Never Pap smear: 08/2019 Mammogram: 02/2022 Colon screening: 01/2019 Vision screening: annually Dentist: biannually  Diet: She does eat meat. She consumes fruits and veggies. She does eat fried foods. She drinks mostly water, tea, Dt. Coke. Exercise: None  Review of Systems     Past Medical History:  Diagnosis Date   Allergy    Depression    Endometriosis determined by laparoscopy 1993   GERD (gastroesophageal reflux disease)    Hypertension     Current Outpatient Medications  Medication Sig Dispense Refill   atorvastatin  (LIPITOR) 20 MG tablet TAKE 1 TABLET BY MOUTH EVERY DAY 90 tablet 0   buPROPion  (WELLBUTRIN  XL) 150 MG 24 hr tablet TAKE THREE TABLETS EVERY MORNING. 90 tablet 5   clindamycin (CLEOCIN) 300 MG capsule Take 300 mg by mouth every 8 (eight) hours.     FLUoxetine  (PROZAC ) 20 MG capsule Take three capsules every day. 90 capsule 5   fluticasone  (FLONASE ) 50 MCG/ACT nasal spray Place 2 sprays into both nostrils daily. 16 g 0   ibuprofen (ADVIL) 800 MG tablet Take 800 mg by mouth 3 (three) times daily.     lisinopril  (ZESTRIL ) 20 MG tablet TAKE 1 TABLET BY MOUTH EVERY DAY 30 tablet 0   omeprazole  (PRILOSEC) 20 MG capsule TAKE 1 CAPSULE BY MOUTH EVERY DAY 30 capsule 2   No current facility-administered medications for this visit.    Allergies  Allergen Reactions   Amoxicillin    Amoxicillin-Pot Clavulanate Rash   Tetracycline Rash    Family History  Problem Relation Age of Onset   Alcohol abuse Mother    Drug abuse Mother    Uterine cancer Mother    Alcohol abuse Father    Diabetes Father    Healthy Sister    Stroke Maternal Grandmother    Kidney disease Maternal Grandmother    Breast cancer Neg Hx    Colon cancer Neg Hx     Social  History   Socioeconomic History   Marital status: Divorced    Spouse name: Not on file   Number of children: Not on file   Years of education: Not on file   Highest education level: Some college, no degree  Occupational History   Not on file  Tobacco Use   Smoking status: Former    Types: Cigarettes   Smokeless tobacco: Never   Tobacco comments:    quit x 15 years ago  Vaping Use   Vaping status: Not on file  Substance and Sexual Activity   Alcohol use: Yes    Alcohol/week: 0.0 standard drinks of alcohol    Comment: social   Drug use: Not Currently    Types: Cocaine   Sexual activity: Not Currently  Other Topics Concern   Not on file  Social History Narrative   Not on file   Social Drivers of Health   Financial Resource Strain: Medium Risk (01/17/2024)   Overall Financial Resource Strain (CARDIA)    Difficulty of Paying Living Expenses: Somewhat hard  Food Insecurity: No Food Insecurity (01/17/2024)   Hunger Vital Sign    Worried About Running Out of Food in the Last Year: Never true    Ran Out of Food in the Last Year: Never true  Transportation  Needs: No Transportation Needs (01/17/2024)   PRAPARE - Administrator, Civil Service (Medical): No    Lack of Transportation (Non-Medical): No  Physical Activity: Insufficiently Active (01/17/2024)   Exercise Vital Sign    Days of Exercise per Week: 2 days    Minutes of Exercise per Session: 10 min  Stress: Stress Concern Present (01/17/2024)   [employer] of Occupational Health - Occupational Stress Questionnaire    Feeling of Stress: To some extent  Social Connections: Moderately Isolated (01/17/2024)   Social Connection and Isolation Panel    Frequency of Communication with Friends and Family: More than three times a week    Frequency of Social Gatherings with Friends and Family: Twice a week    Attends Religious Services: 1 to 4 times per year    Active Member of [employer] or Organizations: No    Attends  Engineer, structural: Not on file    Marital Status: Divorced  Intimate Partner Violence: Not on file     Constitutional: Denies fever, malaise, fatigue, headache or abrupt weight changes.  HEENT: Denies eye pain, eye redness, ear pain, ringing in the ears, wax buildup, runny nose, nasal congestion, bloody nose, or sore throat. Respiratory: Denies difficulty breathing, shortness of breath, cough or sputum production.   Cardiovascular: Denies chest pain, chest tightness, palpitations or swelling in the hands or feet.  Gastrointestinal: Denies abdominal pain, bloating, constipation, diarrhea or blood in the stool.  GU: Denies urgency, frequency, pain with urination, burning sensation, blood in urine, odor or discharge. Musculoskeletal: Patient reports chronic knee pain.  Denies decrease in range of motion, difficulty with gait, muscle pain or joint swelling.  Skin: Denies redness, rashes, lesions or ulcercations.  Neurological: Denies dizziness, difficulty with memory, difficulty with speech or problems with balance and coordination.  Psych: Patient has a history of anxiety and depression.  Denies SI/HI.  No other specific complaints in a complete review of systems (except as listed in HPI above).  Objective:   Physical Exam  LMP 06/28/2018   Wt Readings from Last 3 Encounters:  07/04/23 200 lb 12.8 oz (91.1 kg)  07/22/22 192 lb (87.1 kg)  01/19/22 205 lb (93 kg)    General: Appears her stated age, obese in NAD. Skin: Warm, dry and intact.  HEENT: Head: normal shape and size; Eyes: sclera white, no icterus, conjunctiva pink, PERRLA and EOMs intact; Ears: Tm's gray and intact, normal light reflex; Nose: mucosa pink and moist, septum midline; Throat/Mouth: Teeth present, mucosa pink and moist, no exudate, lesions or ulcerations noted.  Neck:  Neck supple, trachea midline.  Enlargement of the left submandibular gland noted.  No thyromegaly present.  Cardiovascular: Normal rate  and rhythm. S1,S2 noted.  No murmur, rubs or gallops noted. No JVD or BLE edema. No carotid bruits noted. Pulmonary/Chest: Normal effort and positive vesicular breath sounds. No respiratory distress. No wheezes, rales or ronchi noted.  Abdomen:  Normal bowel sounds.  Musculoskeletal: Normal flexion and extension of the right elbow.  No signs of joint swelling.  Pain with palpation over the right lateral epicondyle.  Strength 5/5 BUE/BLE.  No difficulty with gait.  Neurological: Alert and oriented. Cranial nerves II-XII grossly intact. Coordination normal.  Psychiatric: Mood and affect normal. Behavior is normal. Judgment and thought content normal.    BMET    Component Value Date/Time   NA 140 07/04/2023 0922   K 4.7 07/04/2023 0922   CL 105 07/04/2023 0922   CO2 24  07/04/2023 0922   GLUCOSE 82 07/04/2023 0922   BUN 13 07/04/2023 0922   CREATININE 0.73 07/04/2023 0922   CALCIUM  9.6 07/04/2023 0922    Lipid Panel     Component Value Date/Time   CHOL 211 (H) 07/04/2023 0922   TRIG 187 (H) 07/04/2023 0922   HDL 82 07/04/2023 0922   CHOLHDL 2.6 07/04/2023 0922   VLDL 44.6 (H) 09/24/2019 1511   LDLCALC 101 (H) 07/04/2023 0922    CBC    Component Value Date/Time   WBC 6.2 07/04/2023 0922   RBC 4.43 07/04/2023 0922   HGB 12.5 07/04/2023 0922   HCT 38.2 07/04/2023 0922   PLT 353 07/04/2023 0922   MCV 86.2 07/04/2023 0922   MCH 28.2 07/04/2023 0922   MCHC 32.7 07/04/2023 0922   RDW 13.1 07/04/2023 0922    Hgb A1C Lab Results  Component Value Date   HGBA1C 5.7 (H) 07/04/2023            Assessment & Plan:   Preventative Health Maintenance:  Flu shot UTD Tetanus UTD Encouraged her to get her COVID booster Discussed Shingrix vaccine, she will check coverage with her insurance company and schedule a nurse visit if she would like to have this done Pap smear UTD Mammogram ordered-she will call to schedule Colon screening UTD Encouraged her to consume a balanced  diet and exercise regimen Advised her to see an eye doctor and dentist annually Will check CBC, c-Met, lipid and A1c today   RTC in 6 months, follow-up chronic conditions Angeline Laura, NP
